# Patient Record
Sex: Male | Born: 1950 | Race: White | Hispanic: No | Marital: Single | Smoking: Never smoker
Health system: Southern US, Community
[De-identification: ages and names within clinical notes are randomized; demographics above are authoritative.]

## PROBLEM LIST (undated history)

## (undated) DIAGNOSIS — I5022 Chronic systolic (congestive) heart failure: Secondary | ICD-10-CM

## (undated) HISTORY — PX: ELBOW BURSA SURGERY: SHX615

## (undated) HISTORY — PX: GALLBLADDER SURGERY: SHX652

## (undated) HISTORY — PX: HERNIA REPAIR: SHX51

---

## 2020-01-23 ENCOUNTER — Emergency Department (HOSPITAL_COMMUNITY): Payer: Medicare HMO

## 2020-01-23 ENCOUNTER — Inpatient Hospital Stay (HOSPITAL_COMMUNITY): Payer: Medicare HMO

## 2020-01-23 ENCOUNTER — Encounter (HOSPITAL_COMMUNITY): Payer: Self-pay

## 2020-01-23 ENCOUNTER — Inpatient Hospital Stay (HOSPITAL_COMMUNITY)
Admission: EM | Admit: 2020-01-23 | Discharge: 2020-01-26 | DRG: 292 | Disposition: A | Discharge status: Home / Self Care | Payer: Medicare HMO | Attending: Internal Medicine | Admitting: Internal Medicine

## 2020-01-23 ENCOUNTER — Other Ambulatory Visit: Payer: Self-pay

## 2020-01-23 DIAGNOSIS — Z8249 Family history of ischemic heart disease and other diseases of the circulatory system: Secondary | ICD-10-CM | POA: Diagnosis not present

## 2020-01-23 DIAGNOSIS — Z79899 Other long term (current) drug therapy: Secondary | ICD-10-CM | POA: Diagnosis not present

## 2020-01-23 DIAGNOSIS — I77810 Thoracic aortic ectasia: Secondary | ICD-10-CM | POA: Diagnosis not present

## 2020-01-23 DIAGNOSIS — Z20822 Contact with and (suspected) exposure to covid-19: Secondary | ICD-10-CM | POA: Diagnosis not present

## 2020-01-23 DIAGNOSIS — G4700 Insomnia, unspecified: Secondary | ICD-10-CM | POA: Diagnosis not present

## 2020-01-23 DIAGNOSIS — R918 Other nonspecific abnormal finding of lung field: Secondary | ICD-10-CM | POA: Diagnosis not present

## 2020-01-23 DIAGNOSIS — E872 Acidosis: Secondary | ICD-10-CM | POA: Diagnosis not present

## 2020-01-23 DIAGNOSIS — J9601 Acute respiratory failure with hypoxia: Secondary | ICD-10-CM | POA: Diagnosis present

## 2020-01-23 DIAGNOSIS — I5023 Acute on chronic systolic (congestive) heart failure: Secondary | ICD-10-CM | POA: Diagnosis not present

## 2020-01-23 DIAGNOSIS — F329 Major depressive disorder, single episode, unspecified: Secondary | ICD-10-CM | POA: Diagnosis present

## 2020-01-23 DIAGNOSIS — I083 Combined rheumatic disorders of mitral, aortic and tricuspid valves: Secondary | ICD-10-CM | POA: Diagnosis present

## 2020-01-23 DIAGNOSIS — R531 Weakness: Secondary | ICD-10-CM | POA: Diagnosis not present

## 2020-01-23 DIAGNOSIS — I5022 Chronic systolic (congestive) heart failure: Secondary | ICD-10-CM | POA: Insufficient documentation

## 2020-01-23 DIAGNOSIS — K219 Gastro-esophageal reflux disease without esophagitis: Secondary | ICD-10-CM | POA: Diagnosis not present

## 2020-01-23 DIAGNOSIS — Z9119 Patient's noncompliance with other medical treatment and regimen: Secondary | ICD-10-CM | POA: Diagnosis not present

## 2020-01-23 DIAGNOSIS — R0602 Shortness of breath: Secondary | ICD-10-CM | POA: Diagnosis not present

## 2020-01-23 DIAGNOSIS — R590 Localized enlarged lymph nodes: Secondary | ICD-10-CM | POA: Diagnosis present

## 2020-01-23 DIAGNOSIS — I340 Nonrheumatic mitral (valve) insufficiency: Secondary | ICD-10-CM | POA: Diagnosis present

## 2020-01-23 DIAGNOSIS — Z515 Encounter for palliative care: Secondary | ICD-10-CM | POA: Diagnosis not present

## 2020-01-23 DIAGNOSIS — J90 Pleural effusion, not elsewhere classified: Secondary | ICD-10-CM | POA: Diagnosis not present

## 2020-01-23 DIAGNOSIS — I428 Other cardiomyopathies: Secondary | ICD-10-CM | POA: Diagnosis present

## 2020-01-23 DIAGNOSIS — C3411 Malignant neoplasm of upper lobe, right bronchus or lung: Secondary | ICD-10-CM | POA: Diagnosis not present

## 2020-01-23 DIAGNOSIS — Z811 Family history of alcohol abuse and dependence: Secondary | ICD-10-CM | POA: Diagnosis not present

## 2020-01-23 DIAGNOSIS — I447 Left bundle-branch block, unspecified: Secondary | ICD-10-CM | POA: Diagnosis not present

## 2020-01-23 DIAGNOSIS — Z9114 Patient's other noncompliance with medication regimen: Secondary | ICD-10-CM

## 2020-01-23 DIAGNOSIS — I5043 Acute on chronic combined systolic (congestive) and diastolic (congestive) heart failure: Secondary | ICD-10-CM | POA: Diagnosis not present

## 2020-01-23 DIAGNOSIS — I509 Heart failure, unspecified: Secondary | ICD-10-CM | POA: Diagnosis not present

## 2020-01-23 DIAGNOSIS — Z7189 Other specified counseling: Secondary | ICD-10-CM | POA: Diagnosis not present

## 2020-01-23 HISTORY — DX: Chronic systolic (congestive) heart failure: I50.22

## 2020-01-23 LAB — BASIC METABOLIC PANEL
Anion gap: 11 (ref 5–15)
BUN: 32 mg/dL — ABNORMAL HIGH (ref 8–23)
CO2: 18 mmol/L — ABNORMAL LOW (ref 22–32)
Calcium: 9 mg/dL (ref 8.9–10.3)
Chloride: 109 mmol/L (ref 98–111)
Creatinine, Ser: 1.09 mg/dL (ref 0.61–1.24)
GFR calc Af Amer: >60 mL/min (ref >60)
GFR calc non Af Amer: >60 mL/min (ref >60)
Glucose, Bld: 111 mg/dL — ABNORMAL HIGH (ref 70–99)
Potassium: 4 mmol/L (ref 3.5–5.1)
Sodium: 138 mmol/L (ref 135–145)

## 2020-01-23 LAB — CBC WITH DIFFERENTIAL/PLATELET
Abs Immature Granulocytes: 0.03 10*3/uL (ref 0.00–0.07)
Basophils Absolute: 0 10*3/uL (ref 0.0–0.1)
Basophils Relative: 1 %
Eosinophils Absolute: 0.2 10*3/uL (ref 0.0–0.5)
Eosinophils Relative: 3 %
HCT: 40.6 % (ref 39.0–52.0)
Hemoglobin: 13 g/dL (ref 13.0–17.0)
Immature Granulocytes: 1 %
Lymphocytes Relative: 25 %
Lymphs Abs: 1.6 10*3/uL (ref 0.7–4.0)
MCH: 28.8 pg (ref 26.0–34.0)
MCHC: 32 g/dL (ref 30.0–36.0)
MCV: 89.8 fL (ref 80.0–100.0)
Monocytes Absolute: 0.7 10*3/uL (ref 0.1–1.0)
Monocytes Relative: 11 %
Neutro Abs: 4.1 10*3/uL (ref 1.7–7.7)
Neutrophils Relative %: 59 %
Platelets: 203 10*3/uL (ref 150–400)
RBC: 4.52 MIL/uL (ref 4.22–5.81)
RDW: 14 % (ref 11.5–15.5)
WBC: 6.6 10*3/uL (ref 4.0–10.5)
nRBC: 0 % (ref 0.0–0.2)

## 2020-01-23 LAB — TROPONIN I (HIGH SENSITIVITY)
Troponin I (High Sensitivity): 23 ng/L — ABNORMAL HIGH (ref <18)
Troponin I (High Sensitivity): 28 ng/L — ABNORMAL HIGH (ref <18)

## 2020-01-23 LAB — SARS CORONAVIRUS 2 (TAT 6-24 HRS): SARS Coronavirus 2: NEGATIVE

## 2020-01-23 LAB — BRAIN NATRIURETIC PEPTIDE: B Natriuretic Peptide: 1845.6 pg/mL — ABNORMAL HIGH (ref 0.0–100.0)

## 2020-01-23 MED ORDER — ACETAMINOPHEN 325 MG PO TABS: 650.0000 mg | ORAL_TABLET | ORAL | Status: DC | PRN

## 2020-01-23 MED ORDER — DOXYLAMINE SUCCINATE (SLEEP) 25 MG PO TABS
25.0000 mg | ORAL_TABLET | Freq: Every evening | ORAL | Status: DC | PRN
  Filled 2020-01-23 (×2): qty 1

## 2020-01-23 MED ORDER — ENOXAPARIN SODIUM 40 MG/0.4ML ~~LOC~~ SOLN
40.0000 mg | SUBCUTANEOUS | Status: DC
  Administered 2020-01-23 – 2020-01-25 (×3): 40 mg via SUBCUTANEOUS
  Filled 2020-01-23 (×3): qty 0.4

## 2020-01-23 MED ORDER — SODIUM CHLORIDE 0.9 % IV SOLN: 250.0000 mL | INTRAVENOUS | Status: DC | PRN

## 2020-01-23 MED ORDER — FUROSEMIDE 10 MG/ML IJ SOLN
20.0000 mg | Freq: Once | INTRAMUSCULAR | Status: AC
  Administered 2020-01-23: 10:00:00 20 mg via INTRAVENOUS
  Filled 2020-01-23: qty 4

## 2020-01-23 MED ORDER — PANTOPRAZOLE SODIUM 40 MG PO TBEC
40.0000 mg | DELAYED_RELEASE_TABLET | Freq: Every day | ORAL | Status: DC
  Administered 2020-01-24 – 2020-01-26 (×3): 40 mg via ORAL
  Filled 2020-01-23 (×3): qty 1

## 2020-01-23 MED ORDER — FUROSEMIDE 10 MG/ML IJ SOLN
20.0000 mg | Freq: Two times a day (BID) | INTRAMUSCULAR | Status: DC
  Administered 2020-01-24: 20 mg via INTRAVENOUS
  Filled 2020-01-23: qty 2

## 2020-01-23 MED ORDER — SODIUM CHLORIDE 0.9% FLUSH
3.0000 mL | Freq: Two times a day (BID) | INTRAVENOUS | Status: DC
  Administered 2020-01-23 – 2020-01-25 (×5): 3 mL via INTRAVENOUS

## 2020-01-23 MED ORDER — ONDANSETRON HCL 4 MG/2ML IJ SOLN: 4.0000 mg | Freq: Four times a day (QID) | INTRAMUSCULAR | Status: DC | PRN

## 2020-01-23 MED ORDER — SODIUM CHLORIDE 0.9% FLUSH: 3.0000 mL | INTRAVENOUS | Status: DC | PRN

## 2020-01-23 NOTE — ED Notes (Addendum)
Patient kept on falling asleep and desat to 6s. Dr. Tyrone Nine aware of this issue. When patient is awake his spo2 is 98%. RN put patient on 2L [place] for comfort when he sleeps. Patient spo2 now 97%.

## 2020-01-23 NOTE — ED Provider Notes (Signed)
Philip Campbell DEPT Provider Note   CSN: [id number] Arrival date & time: 01/23/20  0759     History Chief Complaint  Patient presents with  . Shortness of Breath    Philip Campbell is a 69 y.o. male.  69 yo M with a chief complaints of fatigue and shortness of breath.  This been going on for the past week.  States that it usually is worse with sitting or lying back flat.  Has been having a lot of trouble lying back flat especially when he tries to go to sleep.  Gets mildly better with exertion.  He denies cough or fever denies chest pain or pressure.  States he had a recent work-up  The history is provided by the patient.  Shortness of Breath Severity:  Moderate Onset quality:  Gradual Duration:  2 days Timing:  Constant Progression:  Worsening Chronicity:  New Relieved by:  Nothing Worsened by:  Activity Ineffective treatments:  None tried Associated symptoms: no abdominal pain, no chest pain, no fever, no headaches, no rash and no vomiting        History reviewed. No pertinent past medical history.  Patient Active Problem List   Diagnosis Date Noted  . Acute on chronic systolic CHF (congestive heart failure) ([place]) 01/23/2020  . GERD (gastroesophageal reflux disease) 01/23/2020  . Chronic systolic CHF (congestive heart failure), NYHA class 4 ([place]) 01/23/2020  . Non-ischemic cardiomyopathy ([place]) 01/23/2020    History reviewed. No pertinent surgical history.     No family history on file.  Social History   Tobacco Use  . Smoking status: Not on file  Substance Use Topics  . Alcohol use: Not on file  . Drug use: Not on file    Home Medications Prior to Admission medications   Medication Sig Start Date End Date Taking? Authorizing Provider  doxylamine, Sleep, (UNISOM) 25 MG tablet Take 50 mg by mouth at bedtime as needed for sleep.   Yes [provider]  furosemide (LASIX) 20 MG tablet Take 20 mg by mouth daily.  12/17/19  Yes  [provider]  pantoprazole (PROTONIX) 40 MG tablet Take 40 mg by mouth daily. 12/08/19  Yes [provider]    Allergies    Patient has no known allergies.  Review of Systems   Review of Systems  Constitutional: Positive for fatigue. Negative for chills and fever.  HENT: Negative for congestion and facial swelling.   Eyes: Negative for discharge and visual disturbance.  Respiratory: Positive for shortness of breath.   Cardiovascular: Negative for chest pain and palpitations.  Gastrointestinal: Negative for abdominal pain, diarrhea and vomiting.  Musculoskeletal: Negative for arthralgias and myalgias.  Skin: Negative for color change and rash.  Neurological: Negative for tremors, syncope and headaches.  Psychiatric/Behavioral: Negative for confusion and dysphoric mood.    Physical Exam Updated Vital Signs BP 91/79   Pulse 96   Temp (!) 97.4 F (36.3 C) (Oral)   Resp 16   Ht 5\' 11"  (1.803 m)   Wt 70.3 kg   SpO2 98%   BMI 21.62 kg/m   Physical Exam Vitals and nursing note reviewed.  Constitutional:      Appearance: He is well-developed.  HENT:     Head: Normocephalic and atraumatic.  Eyes:     Pupils: Pupils are equal, round, and reactive to light.  Neck:     Vascular: JVD (to the jaw) present.  Cardiovascular:     Rate and Rhythm: Normal rate and  regular rhythm.     Heart sounds: No murmur. No friction rub. No gallop.   Pulmonary:     Effort: No respiratory distress.     Breath sounds: Examination of the right-lower field reveals rales. Examination of the left-lower field reveals rales. Rales present. No wheezing.     Comments: Faint rales in the bases Abdominal:     General: There is no distension.     Tenderness: There is no guarding or rebound.  Musculoskeletal:        General: Normal range of motion.     Cervical back: Normal range of motion and neck supple.  Skin:    Coloration: Skin is not pale.     Findings: No rash.  Neurological:      Mental Status: He is alert and oriented to person, place, and time.  Psychiatric:        Behavior: Behavior normal.     ED Results / Procedures / Treatments   Labs (all labs ordered are listed, but only abnormal results are displayed) Labs Reviewed  BASIC METABOLIC PANEL - Abnormal; Notable for the following components:      Result Value   CO2 18 (*)    Glucose, Bld 111 (*)    BUN 32 (*)    All other components within normal limits  BRAIN NATRIURETIC PEPTIDE - Abnormal; Notable for the following components:   B Natriuretic Peptide 1,845.6 (*)    All other components within normal limits  TROPONIN I (HIGH SENSITIVITY) - Abnormal; Notable for the following components:   Troponin I (High Sensitivity) 23 (*)    All other components within normal limits  TROPONIN I (HIGH SENSITIVITY) - Abnormal; Notable for the following components:   Troponin I (High Sensitivity) 28 (*)    All other components within normal limits  SARS CORONAVIRUS 2 (TAT 6-24 HRS)  CBC WITH DIFFERENTIAL/PLATELET    EKG EKG Interpretation  Date/Time:  Wednesday January 23 2020 08:30:28 EDT Ventricular Rate:  96 PR Interval:    QRS Duration: 143 QT Interval:  409 QTC Calculation: 517 R Axis:   -68 Text Interpretation: Sinus rhythm Left atrial enlargement Left bundle branch block No old tracing to compare Confirmed by Deno Etienne [phone]) on 01/23/2020 8:36:04 AM   Radiology DG Chest 2 View  Result Date: 01/23/2020 CLINICAL DATA:  Shortness of breath. History of CHF EXAM: CHEST - 2 VIEW COMPARISON:  None. FINDINGS: Cardiomegaly. Pulmonary vascular congestion and bilateral interstitial prominence. Slightly ill-defined small rounded opacity within the peripheral aspect of the right upper lobe, which may reflect an area of edema or focal infiltrate. No pleural effusion or pneumothorax. Degenerative changes of the bilateral shoulders and thoracic spine. IMPRESSION: 1. Cardiomegaly with pulmonary vascular congestion  and interstitial prominence most consistent with CHF. 2. Slightly ill-defined rounded opacity within the peripheral aspect of the right upper lobe, which may reflect an area of edema or focal infiltrate. Radiographic follow-up to resolution is recommended to exclude an underlying nodule. Electronically Signed   By: Davina Poke D.O.   On: 01/23/2020 08:58    Procedures Ultrasound ED Echo  Date/Time: 01/23/2020 9:30 AM Performed by: Deno Etienne, DO Authorized by: Deno Etienne, DO   Procedure details:    Indications: dyspnea     Views: subxiphoid, parasternal long axis view, apical 4 chamber view and IVC view     Images: archived   Findings:    Pericardium: no pericardial effusion     LV Function: severly depressed (<30%)  RV Diameter: normal     IVC: dilated   Impression:    Impression: probable elevated CVP and decreased contractility     (including critical care time)  Medications Ordered in ED Medications  furosemide (LASIX) injection 20 mg (20 mg Intravenous Given 01/23/20 1019)    ED Course  I have reviewed the triage vital signs and the nursing notes.  Pertinent labs & imaging results that were available during my care of the patient were reviewed by me and considered in my medical decision making (see chart for details).    MDM Rules/Calculators/A&P                      69 yo M with a chief complaint of shortness of breath.  Going on for about 6 days.  Having some orthopnea with this.  Exam with significant JVD.  Reportedly had a recent visits to the [facility] facility however unable to obtain any records through care everywhere.  We will try to manually obtain them.  Chest x-ray lab work reassess.  Chest x-ray viewed by me with interstitial fluid.  Widened cardiac silhouette.  Bedside ultrasound also confirms decreased EF.  No pericardial effusion.  Acidosis of unknown etiology.  Will give a bolus dose of Lasix.  Of note when I went to reexamine the patient his oxygen  saturation was 84 with a good waveform.  Immediately improved upon awakening.  We were able to obtain old records from [place].  Patient had a visit in October where he was newly diagnosed with heart failure.  Had an EF of 20 to 25%.  Cardiac cath mostly clean.  O2 down into the 60's when asleep with good waveform.  Discussed with hospitalist for admission.   CRITICAL CARE Performed by: Cecilio Asper   Total critical care time: 35 minutes  Critical care time was exclusive of separately billable procedures and treating other patients.  Critical care was necessary to treat or prevent imminent or life-threatening deterioration.  Critical care was time spent personally by me on the following activities: development of treatment plan with patient and/or surrogate as well as nursing, discussions with consultants, evaluation of patient's response to treatment, examination of patient, obtaining history from patient or surrogate, ordering and performing treatments and interventions, ordering and review of laboratory studies, ordering and review of radiographic studies, pulse oximetry and re-evaluation of patient's condition.  The patients results and plan were reviewed and discussed.   Any x-rays performed were independently reviewed by myself.   Differential diagnosis were considered with the presenting HPI.  Medications  furosemide (LASIX) injection 20 mg (20 mg Intravenous Given 01/23/20 1019)    Vitals:   01/23/20 0825 01/23/20 0830 01/23/20 1016  BP: 104/87 105/86 91/79  Pulse: (!) 103 99 96  Resp: (!) 26 16 16   Temp: (!) 97.4 F (36.3 C)    TempSrc: Oral    SpO2: 100% 100% 98%  Weight: 70.3 kg    Height: 5\' 11"  (1.803 m)      Final diagnoses:  Acute on chronic systolic congestive heart failure (HCC)    Admission/ observation were discussed with the admitting physician, patient and/or family and they are comfortable with the plan.   Final Clinical Impression(s) /  ED Diagnoses Final diagnoses:  Acute on chronic systolic congestive heart failure [facility])    Rx / DC Orders ED Discharge Orders    None       Deno Etienne, DO 01/23/20 1142

## 2020-01-23 NOTE — H&P (Addendum)
History and Physical  Philip Campbell ENI:[id number] DOB: April 26, 1951 DOA: 01/23/2020  Referring physician: Deno Etienne, ER physician PCP: Patient, No Pcp Per  Outpatient Specialists: Scheduled to see outpatient cardiology in [place], had not gone Patient coming from: Home & is able to ambulate without assistance  Chief Complaint: Shortness of breath  Note: Patient's previous hospitalization was in September 2020 at [facility] in [place].  For some reason, these records are not showing in epic.  [facility] sent over records by fax and much of these reports are mentioned in this H&P.  HPI: Philip Campbell is a 69 y.o. male with medical history significant for GERD and nonischemic cardiomyopathy/severe mitral regurg/systolic CHF with ejection fraction of 20% diagnosed 6 months ago at [facility] in [place].  Patient had come in for shortness of breath and found to have pneumonia, pulmonary nodules and during course of work-up discovered to have advanced CHF.  At that time, he was diuresed and discharged home on Lasix, ACE inhibitor and beta-blocker and was told to follow-up with cardiology.  In that time, patient had since moved from [place] to [place].  He was not able to follow-up with cardiology.  As per his doctor's instructions, he had to stop taking his beta-blocker and ACE inhibitor due to low blood pressure, at times systolic less than 90.  For the last 6 days, he has noted increasing dyspnea on exertion along with a nonproductive cough.  He developed orthopnea, managed by sleeping propped up.  Patient tells me that at time of discharge from the hospital, he weighed 140 pounds.  In that time, his weight has increased to around 155 pounds, to his knowledge.  With his worsening shortness of breath, he came to the emergency room today for further evaluation.  ED Course: In the emergency room, patient was noted to have a BNP of 1846.  His weight was 168 pounds.  Chest x-ray  consistent with CHF.  Initially oxygen saturations were above 90% on room air, but when patient fell asleep, his oxygen saturations dropped to 78% on room air.  Placed on 2 L and oxygen levels went above 90 again.  Patient given IV Lasix and hospitalist called for further evaluation and admission.  Review of Systems: Patient seen in the emergency room. Pt complains of mild shortness of breath, although better with oxygen.  Complains of orthopnea and nonproductive cough  Pt denies any headaches, vision changes, dysphagia, chest pain, palpitations, wheezing, abdominal pain, hematuria, dysuria, constipation or diarrhea, focal extremity numbness weakness or pain.  Review of systems are otherwise negative   Past medical history: Nonischemic cardiomyopathy, systolic CHF with ejection fraction of 20-25%, moderate to severe mitral regurg, GERD, pulmonary nodules  Surgical history: Patient had bracket placed in his left elbow after injury  Social History: Lives with a roommate here in [place].  Denies any tobacco, alcohol or drug use.  Ambulates without assistance.  No Known Allergies  Family history: Patient denies any medical problems run in his family  Prior to Admission medications   Medication Sig Start Date End Date Taking? Authorizing Provider  doxylamine, Sleep, (UNISOM) 25 MG tablet Take 50 mg by mouth at bedtime as needed for sleep.   Yes [provider]  furosemide (LASIX) 20 MG tablet Take 20 mg by mouth daily.  12/17/19  Yes [provider]  pantoprazole (PROTONIX) 40 MG tablet Take 40 mg by mouth daily. 12/08/19  Yes [provider]    Physical Exam: BP 94/80 (BP Location:  Right Arm)   Pulse 96   Temp 97.6 F (36.4 C) (Oral)   Resp (!) 24   Ht 5\' 11"  (1.803 m)   Wt 76.7 kg   SpO2 97%   BMI 23.57 kg/m   General: Alert and oriented x3, no acute distress Eyes: Sclera nonicteric, extraocular movements are intact ENT: Normocephalic and atraumatic,  mucous membranes are moist Neck: Supple, mild JVD Cardiovascular: Regular rate and rhythm, S1-S2, 2 out of 6 systolic ejection murmur Respiratory: Clear to auscultation bilaterally Abdomen: Soft, nontender, nondistended, positive bowel sounds Skin: No skin breaks, tears or lesions Musculoskeletal: No clubbing or cyanosis, 1+ pitting edema from the knees down bilaterally Psychiatric: Appropriate, no evidence of psychoses Neurologic: No focal deficits          Labs on Admission:  Basic Metabolic Panel: Recent Labs  Lab 01/23/20 0837  NA 138  K 4.0  CL 109  CO2 18*  GLUCOSE 111*  BUN 32*  CREATININE 1.09  CALCIUM 9.0   Liver Function Tests: No results for input(s): AST, ALT, ALKPHOS, BILITOT, PROT, ALBUMIN in the last 168 hours. No results for input(s): LIPASE, AMYLASE in the last 168 hours. No results for input(s): AMMONIA in the last 168 hours. CBC: Recent Labs  Lab 01/23/20 0837  WBC 6.6  NEUTROABS 4.1  HGB 13.0  HCT 40.6  MCV 89.8  PLT 203   Cardiac Enzymes: No results for input(s): CKTOTAL, CKMB, CKMBINDEX, TROPONINI in the last 168 hours.  BNP (last 3 results) Recent Labs    01/23/20 0837  BNP 1,845.6*    ProBNP (last 3 results) No results for input(s): PROBNP in the last 8760 hours.  CBG: No results for input(s): GLUCAP in the last 168 hours.  Radiological Exams on Admission: DG Chest 2 View  Result Date: 01/23/2020 CLINICAL DATA:  Shortness of breath. History of CHF EXAM: CHEST - 2 VIEW COMPARISON:  None. FINDINGS: Cardiomegaly. Pulmonary vascular congestion and bilateral interstitial prominence. Slightly ill-defined small rounded opacity within the peripheral aspect of the right upper lobe, which may reflect an area of edema or focal infiltrate. No pleural effusion or pneumothorax. Degenerative changes of the bilateral shoulders and thoracic spine. IMPRESSION: 1. Cardiomegaly with pulmonary vascular congestion and interstitial prominence most  consistent with CHF. 2. Slightly ill-defined rounded opacity within the peripheral aspect of the right upper lobe, which may reflect an area of edema or focal infiltrate. Radiographic follow-up to resolution is recommended to exclude an underlying nodule. Electronically Signed   By: Davina Poke D.O.   On: 01/23/2020 08:58    EKG: Independently reviewed.  Sinus rhythm with left atrial enlargement and left bundle branch block noted.  No previous EKG to compare, however when reviewing patient's records from previous hospitalization at [facility], they comment on some nonspecific ST and T wave changes, left atrial enlargement, but no mention of the left bundle branch block  Assessment/Plan Present on Admission: . Acute on chronic systolic CHF (congestive heart failure) (HCC)/nonischemic cardiomyopathy/moderate to severe mitral regurg causing acute respiratory failure with hypoxia: We will diurese.  Unfortunately, cannot aggressively diurese due to soft blood pressures.  [facility] cardiology will see tomorrow.  Previous heart catheterization noted nonischemia, however bundle branch block does not appear to be present from EKG report last time.  Will defer to cardiology to see if further work-up initiated.  Long-term, if needs continuous follow-up in advanced heart care clinic,?  Mitral repair  . GERD (gastroesophageal reflux disease): Continue PPI  . Multiple lung nodules  on CT: Reported on patient's CT: Has been advised to get repeat CT in December, but does not appear to have had appointment set up.  Will recheck now.  Principal Problem:   Acute on chronic systolic CHF (congestive heart failure) (HCC) Active Problems:   GERD (gastroesophageal reflux disease)   Non-ischemic cardiomyopathy (HCC)   Acute respiratory failure with hypoxia (HCC)   Moderate to severe mitral regurgitation   Multiple lung nodules on CT   DVT prophylaxis: Lovenox  Code Status: Full code as confirmed by patient after  extensive discussion  Family Communication: Patient has no family, declined for me to call his friend until he knows more  Disposition Plan: Anticipate he will be here for several days while we are fully diuresing him, working up his CHF and following up on his lung nodules  Consults called: Cardiology Palliative care  Admission status: Given need for acute hospital services and will be here past 2 midnights, admit as inpatient    Annita Brod MD [facility] Pager [phone]  If 7PM-7AM, please contact night-coverage [email or web address] Password [facility]  01/23/2020, 4:19 PM

## 2020-01-23 NOTE — ED Triage Notes (Signed)
Patient c/o trouble breathing at rest and upon exertion. Patient denies chest pain or pain of any kind. Patient has hx: heart issues but does not know what those problems are. Patient also had pneumonia and respiratory failure back in November and was hospitalized. Patient c/o increasing weakness and inability to sleep.

## 2020-01-24 ENCOUNTER — Inpatient Hospital Stay (HOSPITAL_COMMUNITY): Payer: Medicare HMO

## 2020-01-24 ENCOUNTER — Encounter (HOSPITAL_COMMUNITY): Payer: Self-pay | Admitting: Internal Medicine

## 2020-01-24 DIAGNOSIS — I428 Other cardiomyopathies: Secondary | ICD-10-CM

## 2020-01-24 DIAGNOSIS — J9601 Acute respiratory failure with hypoxia: Secondary | ICD-10-CM

## 2020-01-24 DIAGNOSIS — R531 Weakness: Secondary | ICD-10-CM

## 2020-01-24 DIAGNOSIS — Z515 Encounter for palliative care: Secondary | ICD-10-CM

## 2020-01-24 DIAGNOSIS — G4700 Insomnia, unspecified: Secondary | ICD-10-CM

## 2020-01-24 DIAGNOSIS — I5023 Acute on chronic systolic (congestive) heart failure: Secondary | ICD-10-CM

## 2020-01-24 DIAGNOSIS — R918 Other nonspecific abnormal finding of lung field: Secondary | ICD-10-CM

## 2020-01-24 DIAGNOSIS — Z7189 Other specified counseling: Secondary | ICD-10-CM

## 2020-01-24 DIAGNOSIS — I340 Nonrheumatic mitral (valve) insufficiency: Secondary | ICD-10-CM

## 2020-01-24 DIAGNOSIS — I509 Heart failure, unspecified: Secondary | ICD-10-CM

## 2020-01-24 LAB — BASIC METABOLIC PANEL
Anion gap: 8 (ref 5–15)
BUN: 36 mg/dL — ABNORMAL HIGH (ref 8–23)
CO2: 23 mmol/L (ref 22–32)
Calcium: 8.8 mg/dL — ABNORMAL LOW (ref 8.9–10.3)
Chloride: 106 mmol/L (ref 98–111)
Creatinine, Ser: 1.13 mg/dL (ref 0.61–1.24)
GFR calc Af Amer: >60 mL/min (ref >60)
GFR calc non Af Amer: >60 mL/min (ref >60)
Glucose, Bld: 111 mg/dL — ABNORMAL HIGH (ref 70–99)
Potassium: 4.1 mmol/L (ref 3.5–5.1)
Sodium: 137 mmol/L (ref 135–145)

## 2020-01-24 LAB — ECHOCARDIOGRAM COMPLETE
Height: 71 in
Weight: 2737.6 oz

## 2020-01-24 LAB — MAGNESIUM: Magnesium: 1.8 mg/dL (ref 1.7–2.4)

## 2020-01-24 MED ORDER — FUROSEMIDE 10 MG/ML IJ SOLN: 40.0000 mg | Freq: Two times a day (BID) | INTRAMUSCULAR | Status: DC

## 2020-01-24 MED ORDER — MIRTAZAPINE 15 MG PO TBDP
7.5000 mg | ORAL_TABLET | Freq: Every day | ORAL | Status: DC
  Administered 2020-01-24 – 2020-01-25 (×2): 7.5 mg via ORAL
  Filled 2020-01-24 (×3): qty 0.5

## 2020-01-24 MED ORDER — CARVEDILOL 3.125 MG PO TABS
3.1250 mg | ORAL_TABLET | Freq: Two times a day (BID) | ORAL | Status: DC
  Administered 2020-01-24 – 2020-01-26 (×3): 3.125 mg via ORAL
  Filled 2020-01-24 (×4): qty 1

## 2020-01-24 MED ORDER — FUROSEMIDE 10 MG/ML IJ SOLN
40.0000 mg | Freq: Every day | INTRAMUSCULAR | Status: DC
  Administered 2020-01-24: 40 mg via INTRAVENOUS
  Filled 2020-01-24: qty 4

## 2020-01-24 NOTE — Progress Notes (Signed)
PROGRESS NOTE    Philip Campbell  HMC:[id number] DOB: [birth year] DOA: 01/23/2020 PCP: Patient, No Pcp Per    Brief Narrative:  Patient was admitted to the hospital with working diagnosis of acute on chronic decompensated systolic heart failure.  69 year old male who presented with dyspnea.  He does have significant past medical history for nonischemic cardiomyopathy, severe mitral vegetation, systolic heart failure and GERD.  Patient was recently diagnosed with heart failure, placed on heart failure regimen including afterload reducing agents.  Currently ACE inhibitor was stopped due to hypotension.  Her last 6 days he has developed worsening dyspnea, associated with orthopnea and 15 pound weight gain.  On his initial physical examination his blood pressure was 94/80, pulse rate 96, temperature 97.6, respiratory rate 24, oxygen saturation 97%, lungs were clear to auscultation bilaterally, heart S1-S2 present rhythmic, soft abdomen, no lower extremity edema.  Further work up with CT chest showed a right upper lobe mass. He has been started on aggressive diuresis for heart failure. Further heart failure management and advanced therapies will depend on results of pulmonary work up, if malignancy confirmed likely not candidate for more invasive heart failure interventions.    Assessment & Plan:   Principal Problem:   Acute on chronic systolic CHF (congestive heart failure) (HCC) Active Problems:   GERD (gastroesophageal reflux disease)   Non-ischemic cardiomyopathy (HCC)   Acute respiratory failure with hypoxia (HCC)   Moderate to severe mitral regurgitation   Multiple lung nodules on CT   1. Acute on chronic systolic heart failure decompensation, non ischemic cardiomyopathy. Continue fluid overloaded, urine output over last 24 H is 1,950 ml, blood pressure 112 mmHg.   Will continue diuresis with furosemide and b blockade with carvedilol.   2. Right upper lobe mass. 3.8x 2,0x 1,8 cm. Will need  further work up, consulted pulmonary for evaluation if patient will be candidate for diagnostic bronchoscopy or CT guided biopsy.   3. Depression. Continue with mirtazapine.     DVT prophylaxis: enoxaparin   Code Status:  full Family Communication: no family at the bedside  Disposition Plan/ discharge barriers: patient from home barrier for dc decompensated heart failure needing IV therapies.   Consultants:   Cardiology   Pulmonary   Subjective: Patient is feeling better, but not yet back to baseline, continue to have dyspnea with minimal efforts, no nausea or vomiting, no chest pain.   Objective: Vitals:   01/23/20 2350 01/24/20 0356 01/24/20 0956 01/24/20 1256  BP: 105/75 114/85 109/80 112/82  Pulse: 92 (!) 105 98 (!) 104  Resp: (!) 24 (!) 24 20 19   Temp: 97.6 F (36.4 C) 97.9 F (36.6 C) 97.9 F (36.6 C) 98.2 F (36.8 C)  TempSrc: Oral Oral Oral Oral  SpO2: 93% 99% 98% 98%  Weight:  77.6 kg    Height:        Intake/Output Summary (Last 24 hours) at 01/24/2020 1334 Last data filed at 01/24/2020 1200 Gross per 24 hour  Intake 483 ml  Output 3375 ml  Net -2892 ml   Filed Weights   01/23/20 0825 01/23/20 1544 01/24/20 0356  Weight: 70.3 kg 76.7 kg 77.6 kg    Examination:   General: Not in pain or dyspnea, deconditioned  Neurology: Awake and alert, non focal  E ENT: mild pallor, no icterus, oral mucosa moist Cardiovascular: No JVD. S1-S2 present, rhythmic, no gallops, or rubs No lower extremity edema. Positive systolic murmur 3/6 at the apex.  Pulmonary: positive breath sounds bilaterally, no wheezing,  or rhonchi, mild  Rales at bases. Gastrointestinal. Abdomen with, no organomegaly, non tender, no rebound or guarding Skin. No rashes Musculoskeletal: no joint deformities     Data Reviewed: I have personally reviewed following labs and imaging studies  CBC: Recent Labs  Lab 01/23/20 0837  WBC 6.6  NEUTROABS 4.1  HGB 13.0  HCT 40.6  MCV 89.8  PLT 644    Basic Metabolic Panel: Recent Labs  Lab 01/23/20 0837 01/24/20 0439 01/24/20 0945  NA 138 137  --   K 4.0 4.1  --   CL 109 106  --   CO2 18* 23  --   GLUCOSE 111* 111*  --   BUN 32* 36*  --   CREATININE 1.09 1.13  --   CALCIUM 9.0 8.8*  --   MG  --   --  1.8   GFR: Estimated Creatinine Clearance: 64.8 mL/min (by C-G formula based on SCr of 1.13 mg/dL). Liver Function Tests: No results for input(s): AST, ALT, ALKPHOS, BILITOT, PROT, ALBUMIN in the last 168 hours. No results for input(s): LIPASE, AMYLASE in the last 168 hours. No results for input(s): AMMONIA in the last 168 hours. Coagulation Profile: No results for input(s): INR, PROTIME in the last 168 hours. Cardiac Enzymes: No results for input(s): CKTOTAL, CKMB, CKMBINDEX, TROPONINI in the last 168 hours. BNP (last 3 results) No results for input(s): PROBNP in the last 8760 hours. HbA1C: No results for input(s): HGBA1C in the last 72 hours. CBG: No results for input(s): GLUCAP in the last 168 hours. Lipid Profile: No results for input(s): CHOL, HDL, LDLCALC, TRIG, CHOLHDL, LDLDIRECT in the last 72 hours. Thyroid Function Tests: No results for input(s): TSH, T4TOTAL, FREET4, T3FREE, THYROIDAB in the last 72 hours. Anemia Panel: No results for input(s): VITAMINB12, FOLATE, FERRITIN, TIBC, IRON, RETICCTPCT in the last 72 hours.    Radiology Studies: I have reviewed all of the imaging during this hospital visit personally     Scheduled Meds: . enoxaparin (LOVENOX) injection  40 mg Subcutaneous Q24H  . furosemide  40 mg Intravenous BID  . mirtazapine  7.5 mg Oral QHS  . pantoprazole  40 mg Oral Daily  . sodium chloride flush  3 mL Intravenous Q12H   Continuous Infusions: . sodium chloride       LOS: 1 day        Philip Campbell [name], MD

## 2020-01-24 NOTE — Progress Notes (Signed)
Patient ambulated hallways 180 ft on RA independently.  O2 sats remained 95-97%.  Patient experienced slight SOB but tolerated well.  Returned to room and now sitting up in chair.  Patient has "Living with Heart Failure" booklet at bedside - encouraged patient to look through it and ask questions as needed.  Patient educated on daily weighing and low sodium diet - verbalizes understanding using teach back method.

## 2020-01-24 NOTE — Consult Note (Signed)
Consultation Note Date: 01/24/2020   Patient Name: Philip Campbell  DOB: [birth year]  MRN: [id number]  Age / Sex: 69 y.o., male  PCP: Patient, No Pcp Per Referring Physician: Tawni Millers  Reason for Consultation: Establishing goals of care  HPI/Patient Profile: 69 y.o. male   admitted on 01/23/2020    Clinical Assessment and Goals of Care: 69 year old gentleman originally from [place], moved to [place] not too long ago.  Lives with his roommate and friend Winnifred Friar.  Patient has history of chronic systolic congestive heart failure, nonischemic cardiomyopathy with ejection fraction of 20%.  He also has severe mitral regurgitation and pulmonary nodules and underlying gastroesophageal reflux disease.  Patient has been admitted with weakness and shortness of breath to hospital medicine service.  He is undergoing efforts at diuresis.  He has also been seen and evaluated by cardiology.  Echocardiogram has just been done.  Additionally, CT scan of the chest was also done because the patient had pulmonary nodules and CT scan of the chest was done for follow-up of said nodules.  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  A palliative consult has been requested for CODE STATUS and goals of care discussions.  [name] is awake alert resting in bed.  He complains of dyspnea even with the slightest minimal exertion.  He tried eating his lunch tray and states that it made him very short of breath.  He felt as if he had run up and down the hallway just by trying to eat something.  He denies chest pain.  He is not confused.  I introduced myself and palliative care as follows:   Palliative medicine is specialized medical care for people living with serious illness. It focuses on providing relief from the symptoms and stress of a serious illness. The goal is to improve quality of life for both  the patient and the family.  Goals of care: Broad aims of medical therapy in relation to the patient's values and preferences. Our aim is to provide medical care aimed at enabling patients to achieve the goals that matter most to them, given the circumstances of their particular medical situation and their constraints.   I reviewed with him frankly but compassionately about his current hospitalization and work-up so far.  Discussed with him about CT scan of the chest showing mass in the right upper lobe highly concerning for primary bronchogenic adenocarcinoma.  Also discussed about things that are being done from a cardiology perspective with regards to his congestive heart failure.  Shared my worries and concerns that between his heart and lungs he has couple different serious illnesses going on.  CODE STATUS and goals of care discussions were undertaken.  Advance care planning was also discussed.  Offered active listening and supportive care.  Please note additional discussions and recommendations as listed below.   NEXT OF KIN Roommate, denies that he has any immediate family.  States that when he was living in [place] his landlord used to be his designated healthcare power of  attorney agent.  Unfortunately, he had a bad experience with that situation.  SUMMARY OF RECOMMENDATIONS   Full code/full scope for now.  Patient wishes to continue with current mode of care.  He is aware of the serious nature of his illness.  Monitor overall disease trajectory for ongoing goals of care based on hospital course. Patient elects his roommate and friend Winnifred Friar to be his healthcare power of attorney agent if the patient is not able to make such decisions.  He refrains from going through with the paperwork. We will add low-dose Remeron for sleep and appetite stimulation. Continue current mode of care, cardiology recommendations noted.  Briefly discussed with TR[facility] MD.  Also aware of CT scan of the chest  showing right upper lobe primary bronchogenic adenocarcinoma possibility. Thank you for the consult.  Code Status/Advance Care Planning:  Full code    Symptom Management:   As above  Palliative Prophylaxis:   Delirium Protocol   Psycho-social/Spiritual:   Desire for further Chaplaincy support:yes  Additional Recommendations: Caregiving  Support/Resources  Prognosis:   Unable to determine  Discharge Planning: To Be Determined      Primary Diagnoses: Present on Admission: . Acute on chronic systolic CHF (congestive heart failure) ([facility]) . GERD (gastroesophageal reflux disease) . Acute respiratory failure with hypoxia ([place]) . Moderate to severe mitral regurgitation . Multiple lung nodules on CT   I have reviewed the medical record, interviewed the patient and family, and examined the patient. The following aspects are pertinent.  Past Medical History:  Diagnosis Date  . Chronic systolic CHF (congestive heart failure) (HCC)    Social History   Socioeconomic History  . Marital status: Single    Spouse name: Not on file  . Number of children: Not on file  . Years of education: Not on file  . Highest education level: Not on file  Occupational History  . Not on file  Tobacco Use  . Smoking status: Never Smoker  . Smokeless tobacco: Never Used  Substance and Sexual Activity  . Alcohol use: Not on file  . Drug use: Not on file  . Sexual activity: Not on file  Other Topics Concern  . Not on file  Social History Narrative  . Not on file   Social Determinants of Health   Financial Resource Strain:   . Difficulty of Paying Living Expenses:   Food Insecurity:   . Worried About Charity fundraiser in the Last Year:   . Arboriculturist in the Last Year:   Transportation Needs:   . Film/video editor (Medical):   Marland Kitchen Lack of Transportation (Non-Medical):   Physical Activity:   . Days of Exercise per Week:   . Minutes of Exercise per Session:   Stress:    . Feeling of Stress :   Social Connections:   . Frequency of Communication with Friends and Family:   . Frequency of Social Gatherings with Friends and Family:   . Attends Religious Services:   . Active Member of Clubs or Organizations:   . Attends Archivist Meetings:   Marland Kitchen Marital Status:    Family History  Problem Relation Age of Onset  . CAD Mother        CABG x3 in her 52's  . Alcoholism Mother   . Heart failure Neg Hx    Scheduled Meds: . enoxaparin (LOVENOX) injection  40 mg Subcutaneous Q24H  . furosemide  40 mg Intravenous BID  . mirtazapine  7.5 mg Oral QHS  . pantoprazole  40 mg Oral Daily  . sodium chloride flush  3 mL Intravenous Q12H   Continuous Infusions: . sodium chloride     PRN Meds:.sodium chloride, acetaminophen, doxylamine (Sleep), ondansetron (ZOFRAN) IV, sodium chloride flush Medications Prior to Admission:  Prior to Admission medications   Medication Sig Start Date End Date Taking? Authorizing Provider  doxylamine, Sleep, (UNISOM) 25 MG tablet Take 50 mg by mouth at bedtime as needed for sleep.   Yes [provider]  furosemide (LASIX) 20 MG tablet Take 20 mg by mouth daily.  12/17/19  Yes [provider]  pantoprazole (PROTONIX) 40 MG tablet Take 40 mg by mouth daily. 12/08/19  Yes [provider]   No Known Allergies Review of Systems + not able to sleep at night  Physical Exam Patient admits to feeling fatigued even with minimal exertion Awake alert sitting comfortably by the side of the bed Denies shortness of breath, regular work of breathing S1-S2 mildly tachycardic, systolic murmur appreciated Abdomen is not distended nontender No edema Nonfocal  Vital Signs: BP 112/82 (BP Location: Right Arm)   Pulse (!) 104   Temp 98.2 F (36.8 C) (Oral)   Resp 19   Ht 5\' 11"  (1.803 m)   Wt 77.6 kg   SpO2 98%   BMI 23.86 kg/m  Pain Scale: 0-10   Pain Score: 0-No pain   SpO2: SpO2: 98 % O2 Device:SpO2:  98 % O2 Flow Rate: .O2 Flow Rate (L/min): 2 L/min  IO: Intake/output summary:   Intake/Output Summary (Last 24 hours) at 01/24/2020 1258 Last data filed at 01/24/2020 1200 Gross per 24 hour  Intake 483 ml  Output 2950 ml  Net -2467 ml    LBM:   Baseline Weight: Weight: 70.3 kg Most recent weight: Weight: 77.6 kg     Palliative Assessment/Data:   PPS 40%  Time In:  12 Time Out:  1300 Time Total:  60  Greater than 50%  of this time was spent counseling and coordinating care related to the above assessment and plan.  Signed by: Loistine Chance, MD   Please contact Palliative Medicine Team phone at [phone] for questions and concerns.  For individual provider: See Shea Evans

## 2020-01-24 NOTE — Progress Notes (Signed)
  Echocardiogram 2D Echocardiogram has been performed.  Randa Lynn Franck Vinal 01/24/2020, 12:51 PM

## 2020-01-24 NOTE — Consult Note (Addendum)
Cardiology Consultation:   Patient ID: Philip Campbell MRN: [id number]; DOB: Apr 09, 1951  Admit date: 01/23/2020 Date of Consult: 01/24/2020  Primary Care Provider: Patient, No Pcp Per Primary Cardiologist: New to [facility] (Dr. Acie Fredrickson) Primary Electrophysiologist:  None    Patient Profile:   Philip Campbell is a 69 y.o. male with a history of chronic systolic CHF/non-ischemic cardiomyopathy with EF of 20%, severe mitral regurgitation, pulmonary nodules, and GERD who is being seen today for the evaluation of acute on chronic CHF at the request of Dr Maryland Pink (Internal Medicine).   History of Present Illness:   Philip Campbell is a 69 year old male with the above history. Patient was admitted to [facility] in [place] in 06/2019 after presenting with shortness of breath. I am unable to personally review records at this time. However, they were summarized in ED note and H&P. During that admission, he was found to have pneumonia and multiple pulmonary nodules as well as significant CHF with EF of 20%. He reportedly had a cardiac cath at time which showed mostly normal coronary arteries. He was also told that all of his heart valves were "bad" and looks like he had severe mitral regurgitation. He was diuresed and discharged on Lasix, ACEi, and beta-blocker and told to follow-up with Cardiology. However, he ultimately had to stop the ACEi and beta-blocker due to low BPs with systolic BP reportedly less than 90 at times. Since then, he moved from [place] to [place] and has not been able to follow-up with Cardiology.   Patient presented to the [facility] ED on 01/23/2020 for evaluation of worsening shortness of breath and orthopnea. Patient was doing pretty well until about 5-6 days ago when he started to noticed significant shortness of breath mostly at night. He describes orthopnea and PND and states he has not really been able to sleep at all the last several days. No lower extremity edema but he does  report about a 30lb weight gain since 07/2019. He states that after being discharged from [facility] he got down to 140 to 145 lbs and now he is back up to 170 lbs. He also notes significant fatigue the last several days and states he just has not had any energy. He reports non-radiating substernal chest pressure associated with the shortness of breath. Non-exertional and worse when he lays down. No diaphoresis, nausea, or vomiting. On his home BP machine, he has noticed that his heart rates are often in the 100's but denies any palpitations. No lightheadedness,dizziness, or syncope. No recent fevers or illnesses.  In the ED, initially O2s were above 90's on room air. However, when patient fell asleep, sats dropped to 78% so he was placed on 2L of O2 with improvement. EKG showed normal sinus rhythm with LVH with repolarization changes and LBBB (no prior tracing available for comparison). High-sensitivity troponin minimally elevated and flat at 23 >> 28. BNP elevated at 1,845. Chest x-ray showed cardiomegaly with pulmonary vascular congestion and interstitial prominence most consistent with CHF as well as a slightly ill-defined rounded opacity within the peripheral aspect of the right upper lobe. WBC 6.6, Hgb 13.0, Plts 203. Na 138, K 4.0, Glucose 111, BUN 32, Cr 1.09. COVID-19 negative. Patient given dose of IV Lasix 20mg  and admitted for further evaluation.   At the time of this evaluation, patient resting comfortably. He reports shortness of breath has improved some with IV Lasix but breathing does sound labored when he is talking with me. He states he was actually able  to get a few hours of sleep yesterday.  He denies any smoking history or alcohol use. He reports smoking marijuana recreationally when he was in his 20's but none since. No other recreational drug use. He does have a family history of heart disease with his mother having a triple bypass in her 22's. No known family history of heart failure.    Past Medical History:  Diagnosis Date  . Chronic systolic CHF (congestive heart failure) ([place])     History reviewed. No pertinent surgical history.   Home Medications:  Prior to Admission medications   Medication Sig Start Date End Date Taking? Authorizing Provider  doxylamine, Sleep, (UNISOM) 25 MG tablet Take 50 mg by mouth at bedtime as needed for sleep.   Yes [provider]  furosemide (LASIX) 20 MG tablet Take 20 mg by mouth daily.  12/17/19  Yes [provider]  pantoprazole (PROTONIX) 40 MG tablet Take 40 mg by mouth daily. 12/08/19  Yes [provider]    Inpatient Medications: Scheduled Meds: . enoxaparin (LOVENOX) injection  40 mg Subcutaneous Q24H  . furosemide  20 mg Intravenous Q12H  . pantoprazole  40 mg Oral Daily  . sodium chloride flush  3 mL Intravenous Q12H   Continuous Infusions: . sodium chloride     PRN Meds: sodium chloride, acetaminophen, doxylamine (Sleep), ondansetron (ZOFRAN) IV, sodium chloride flush  Allergies:   No Known Allergies  Social History:   Social History   Socioeconomic History  . Marital status: Single    Spouse name: Not on file  . Number of children: Not on file  . Years of education: Not on file  . Highest education level: Not on file  Occupational History  . Not on file  Tobacco Use  . Smoking status: Never Smoker  . Smokeless tobacco: Never Used  Substance and Sexual Activity  . Alcohol use: Not on file  . Drug use: Not on file  . Sexual activity: Not on file  Other Topics Concern  . Not on file  Social History Narrative  . Not on file   Social Determinants of Health   Financial Resource Strain:   . Difficulty of Paying Living Expenses:   Food Insecurity:   . Worried About Charity fundraiser in the Last Year:   . Arboriculturist in the Last Year:   Transportation Needs:   . Film/video editor (Medical):   Marland Kitchen Lack of Transportation (Non-Medical):   Physical Activity:   . Days  of Exercise per Week:   . Minutes of Exercise per Session:   Stress:   . Feeling of Stress :   Social Connections:   . Frequency of Communication with Friends and Family:   . Frequency of Social Gatherings with Friends and Family:   . Attends Religious Services:   . Active Member of Clubs or Organizations:   . Attends Archivist Meetings:   Marland Kitchen Marital Status:   Intimate Partner Violence:   . Fear of Current or Ex-Partner:   . Emotionally Abused:   Marland Kitchen Physically Abused:   . Sexually Abused:     Family History:    Family History  Problem Relation Age of Onset  . CAD Mother        CABG x3 in her 3's  . Alcoholism Mother   . Heart failure Neg Hx      ROS:  Please see the history of present illness.  Review of Systems  Constitutional:  Positive for malaise/fatigue. Negative for diaphoresis, fever and weight loss.  HENT: Negative for congestion.   Respiratory: Positive for shortness of breath. Negative for cough.   Cardiovascular: Positive for chest pain, orthopnea and PND. Negative for palpitations and leg swelling.  Gastrointestinal: Negative for abdominal pain, blood in stool, nausea and vomiting.  Genitourinary: Negative for hematuria.  Musculoskeletal: Negative for myalgias.  Neurological: Negative for dizziness and loss of consciousness.  Endo/Heme/Allergies: Does not bruise/bleed easily.  Psychiatric/Behavioral: Negative for substance abuse.    Physical Exam/Data:   Vitals:   01/23/20 1746 01/23/20 1952 01/23/20 2350 01/24/20 0356  BP: 95/64 92/67 105/75 114/85  Pulse: 89 68 92 (!) 105  Resp: 19 (!) 22 (!) 24 (!) 24  Temp: 98 F (36.7 C) 97.6 F (36.4 C) 97.6 F (36.4 C) 97.9 F (36.6 C)  TempSrc: Oral Oral Oral Oral  SpO2: 97% 91% 93% 99%  Weight:    77.6 kg  Height:        Intake/Output Summary (Last 24 hours) at 01/24/2020 0856 Last data filed at 01/24/2020 2778 Gross per 24 hour  Intake 243 ml  Output 2075 ml  Net -1832 ml   Last 3 Weights  01/24/2020 01/23/2020 01/23/2020  Weight (lbs) 171 lb 1.6 oz 169 lb 155 lb  Weight (kg) 77.61 kg 76.658 kg 70.308 kg     Body mass index is 23.86 kg/m.  General: 69 y.o. male resting comfortably in no acute distress. HEENT: Normocephalic and atraumatic. Sclera clear.  Neck: Supple. No carotid bruits. JVD elevated. Heart: Mildly tachycardic with regular rate. Distinct S1 and S2. III/VI systolic murmur at apex. No gallops , or rubs appreciated. Radial pulses 2+ and equal bilaterally. Lungs: Mild increased work of breathing while talking. Minimal crackles noted in bilateral bases, mostly left base. Abdomen: Soft, non-distended, and non-tender to palpation. Bowel sounds present. Extremities: No lower extremity edema.    Skin: Warm and dry. Neuro: Alert and oriented x3. No focal deficits. Psych: Normal affect. Responds appropriately.   EKG:  The EKG was personally reviewed and demonstrates:  Normal sinus rhythm, rate 96 bpm, with LVH and associated repolarization changes, left atrial enlargement, left axis deviation, and LBBB. QRS 143 ms. QTc 517 ms.   Telemetry:  Telemetry was personally reviewed and demonstrates: Sinus rhythm with rates ranging from the 80's to the 110's (mostly in 90's to low 100's) and PVCs (some couplets).   Relevant CV Studies:  None available to review at this time.  Laboratory Data:  High Sensitivity Troponin:   Recent Labs  Lab 01/23/20 0837 01/23/20 1017  TROPONINIHS 23* 28*     Chemistry Recent Labs  Lab 01/23/20 0837 01/24/20 0439  NA 138 137  K 4.0 4.1  CL 109 106  CO2 18* 23  GLUCOSE 111* 111*  BUN 32* 36*  CREATININE 1.09 1.13  CALCIUM 9.0 8.8*  GFRNONAA >60 >60  GFRAA >60 >60  ANIONGAP 11 8    No results for input(s): PROT, ALBUMIN, AST, ALT, ALKPHOS, BILITOT in the last 168 hours. Hematology Recent Labs  Lab 01/23/20 0837  WBC 6.6  RBC 4.52  HGB 13.0  HCT 40.6  MCV 89.8  MCH 28.8  MCHC 32.0  RDW 14.0  PLT 203   BNP Recent  Labs  Lab 01/23/20 0837  BNP 1,845.6*    DDimer No results for input(s): DDIMER in the last 168 hours.   Radiology/Studies:  DG Chest 2 View  Result Date: 01/23/2020 CLINICAL DATA:  Shortness of  breath. History of CHF EXAM: CHEST - 2 VIEW COMPARISON:  None. FINDINGS: Cardiomegaly. Pulmonary vascular congestion and bilateral interstitial prominence. Slightly ill-defined small rounded opacity within the peripheral aspect of the right upper lobe, which may reflect an area of edema or focal infiltrate. No pleural effusion or pneumothorax. Degenerative changes of the bilateral shoulders and thoracic spine. IMPRESSION: 1. Cardiomegaly with pulmonary vascular congestion and interstitial prominence most consistent with CHF. 2. Slightly ill-defined rounded opacity within the peripheral aspect of the right upper lobe, which may reflect an area of edema or focal infiltrate. Radiographic follow-up to resolution is recommended to exclude an underlying nodule. Electronically Signed   By: Davina Poke D.O.   On: 01/23/2020 08:58   CT CHEST NODULE FOLLOW UP LOW DOSE W/O  Result Date: 01/24/2020 CLINICAL DATA:  68 year old male with history of pulmonary nodules. Follow-up study. EXAM: CT CHEST WITHOUT CONTRAST TECHNIQUE: Multidetector CT imaging of the chest was performed following the standard protocol without IV contrast. COMPARISON:  No priors. FINDINGS: Cardiovascular: Heart size is mildly enlarged. There is no significant pericardial fluid, thickening or pericardial calcification. There is aortic atherosclerosis, as well as atherosclerosis of the great vessels of the mediastinum and the coronary arteries, including calcified atherosclerotic plaque in the left main, left anterior descending and right coronary arteries. Calcifications of the aortic valve. Mediastinum/Nodes: Multiple prominent borderline enlarged mediastinal lymph nodes are noted measuring up to 1.2 cm in short axis in the low right paratracheal  nodal station, nonspecific. Esophagus is unremarkable in appearance. No axillary lymphadenopathy. Lungs/Pleura: Right upper lobe mass measuring 3.8 x 2.0 x 1.8 cm (coronal image 127 of series 4 and axial image 116 of series 3). 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 Widespread areas of interlobular septal thickening in a few patchy areas of mild ground-glass attenuation, suggesting a background of mild interstitial pulmonary edema. Trace bilateral pleural effusions. Upper Abdomen: Aortic atherosclerosis. Musculoskeletal: There are no aggressive appearing lytic or blastic lesions noted in the visualized portions of the skeleton. IMPRESSION: 1. 3.8 x 2.0 x 1.8 cm mass in the right upper lobe highly concerning for primary bronchogenic adenocarcinoma. Further evaluation with PET-CT is strongly recommended in the near future. 2. Cardiomegaly with evidence of mild interstitial pulmonary edema and trace bilateral pleural effusions; imaging findings suggestive of congestive heart failure. 3. Aortic atherosclerosis, in addition to left main and 2 vessel coronary artery disease. Please note that although the presence of coronary artery calcium documents the presence of coronary artery disease, the severity of this disease and any potential stenosis cannot be assessed on this non-gated CT examination. Assessment for potential risk factor modification, dietary therapy or pharmacologic therapy may be warranted, if clinically indicated. 4. There are calcifications of the aortic valve. Echocardiographic correlation for evaluation of potential valvular dysfunction may be warranted if clinically indicated. Aortic Atherosclerosis (ICD10-I70.0). Electronically Signed   By: Vinnie Langton M.D.   On: 01/24/2020 08:16    Assessment and Plan:   Acute on Chronic Systolic CHF with Acute Hypoxic Respiratory Failure - Patient presented  with worsening shortness of breath, orthopnea, PND, and weight gain.  - BNP elevated in the 1,800s.  - Chest x-ray consistent with CHF.  - Echo pending. - Received one dose of IV Lasix 20mg  in the ED. Documented urinary output of 1.7 L since then. Weight 171 lbs today which is up from admission weight (do not know if admission weight is accurate). Renal function stable but  BUN is elevated.  - JVD does appear elevated but does not look extremely volume overloaded nut still sounds very short of breath. Will increase IV Lasix to 40mg  twice daily and monitor BP closely.  - Patient previously has not been able to tolerate ACEi or beta-blocker due to low BPs. Will focus on diuresis right now and then may be able to work on adding back evidence based heart failure medications. - Continue to monitor daily weights, strict I/O's, and renal function.  - Will try to get a hold of outside records for personal review. He may need repeat right/left heart catheterization and work-up for severe mitral regurgitation.   Severe Mitral Regurgitation - Reportedly found to have severe MR during hospitalization in 06/2019. - Repeat Echo pending.  - Will try to obtain outside records.   Elevated Troponin - High-sensitivity troponin minimally elevated and flat at 23 >>28. Not consistent with ACS.  - EKG shows LBBB.  - He notes some chest pressure with associated shortness of breath worse when laying down. Denies anything that sounds like angina. - Reportedly had a clean cardiac catheterization in 06/2019. - Suspect demand ischemia in setting of acute on chronic CHF with respiratory failure. Echo pending. - Will try to obtain outside records (they may be in the process of being scanned into system).  Pulmonary Nodules - Follow-up CT pending. - Management per primary team.   GERD - Continue Protonix.    For questions or updates, please contact [place] Please consult [email or web address] for contact info under      Signed, Darreld Mclean, PA-C  01/24/2020 8:56 AM  Attending Note:   The patient was seen and examined.  Agree with assessment and plan as noted above.  Changes made to the above note as needed.  Patient seen and independently examined with  Sande Rives, PA .   We discussed all aspects of the encounter. I agree with the assessment and plan as stated above.  1.  Acute on chronic combined systolic and diastolic congestive heart failure: The patient has a new diagnosis of congestive heart failure.  His ejection fraction was 20% at the [facility] facility.  He also has severe mitral regurgitation reportedly.  He also has superimposed respiratory failure partially due to his bronchogenic carcinoma.    I have personally reviewed the preliminary echo images performed today at our facility.  He has markedly reduced left ventricular systolic function with an ejection fraction of 15 to 20%.  He has severe mitral regurgitation.  There is mild aortic insufficiency and mild tricuspid regurgitation.  We will continue Lasix 40 mg a day. His blood pressures fairly low so I do not think that he will tolerate Entresto.  We can try adding low-dose carvedilol to see if that might help slow his heart rate down. I try adding extremely low-dose ARB to see if he can tolerate that.  He seem fairly euvolemic at this point .    His severe LV dysfunction is complicated by his severe mitral regurgitation.  Mitral valve is extremely thick and I am not sure that he is a candidate for MitraClip.  We will need to learn a little bit more about his presumed lung cancer before we start down the path toward mitral valve repair or replacement.  It would not make sense to put him through mitral valve repair or replacement if he has a poor prognosis based on his lung cancer.   2.  Severe mitral vegetation. Has severe MR.  The MV is very thickened.  I am not sure if he is a candidate for MitraClip.  A lot of this  depends on what his prognosis is from a lung cancer standpoint.  2.  Lung cancer: I suggest pulmonary consultation.     I have spent a total of 40 minutes with patient reviewing hospital  notes , telemetry, EKGs, labs and examining patient as well as establishing an assessment and plan that was discussed with the patient. > 50% of time was spent in direct patient care.    Thayer Headings, Brooke Bonito., MD, [facility] 01/24/2020, 1:40 PM [address]  [place] Pager [phone]

## 2020-01-24 NOTE — Consult Note (Signed)
NAME:  Philip Campbell, MRN:  [id number], DOB:  [birth year], LOS: 1 ADMISSION DATE:  01/23/2020, CONSULTATION DATE:  4/1 REFERRING MD:  Delphia Grates, CHIEF COMPLAINT:  Lung mass   Brief History   RUL lung mass, never smoker Decompensated HFrEF  History of present illness   Philip Campbell is a 69 year old gentleman admitted with decompensated systolic heart failure.  He was diagnosed with heart failure several months ago during an admission at [facility], but due to difficult social situation has not followed up or been taking medications for his heart failure.  Per cardiology notes he has previously been intolerant to guideline directed medical therapy due to hypotension.  He has progressive dyspnea on exertion with rest, orthopnea, leg edema.  He is a never smoker.  No personal or family history of malignancy. No recent weigth loss.  Past Medical History  HFrEF  Significant Hospital Events     Consults:  Cardiology Palliative Care  Procedures:    Significant Diagnostic Tests:  TTE 4/1--LVEF less than 20% with severe global LV enlargement.  Moderate to severe MR.  Severely dilated LA.  Moderate RV dysfunction with mild TR.  Mildly elevated PASP.  Moderately dilated RA.  Micro Data:    Antimicrobials:    Interim history/subjective:    Objective   Blood pressure 104/81, pulse (!) 102, temperature 97.8 F (36.6 C), temperature source Oral, resp. rate 18, height 5\' 11"  (1.803 m), weight 77.6 kg, SpO2 100 %.        Intake/Output Summary (Last 24 hours) at 01/24/2020 2032 Last data filed at 01/24/2020 1900 Gross per 24 hour  Intake 603 ml  Output 3800 ml  Net -3197 ml   Filed Weights   01/23/20 0825 01/23/20 1544 01/24/20 0356  Weight: 70.3 kg 76.7 kg 77.6 kg    Examination: General: Ill-appearing elderly man sitting up in bed HENT: [place]/AT, eyes anicteric.  Dilated veins on his forehead Lungs: Bibasilar rales, tachypneic when sitting up and trying to talk Cardiovascular: Regular  rate and rhythm, JVD to angle of mandible Abdomen: Soft, nontender Extremities: No clubbing or cyanosis Neuro: Wake alert, answering questions appropriately Derm: No rashes  CT chest personally reviewed-irregular right upper lobe mass very concerning for primary lung cancer.  2 enlarged mediastinal lymph nodes.    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  Non-smoker. [facility] risk calculator 5.6% change of malignancy -Recommend biopsy to confirm diagnosis.  Discussed that endobronchial navigational biopsy and EBUS could be done as one procedure to provide diagnosis and staging, although his heart failure needs to be stabilized prior to this being completed.  Another option for a more prompt diagnosis would be an IR guided biopsy, which would provide a diagnosis but not staging.  Given his severe heart failure, I doubt that he would be a surgical candidate for lobectomy at any point in the near future.  Positive lymph nodes in the mediastinum would preclude being a surgical candidate as well. -Discussed the risks, benefits, alternatives between concurrent navigational bronchoscopy and EBUS versus transcutaneous IR biopsy of the lung.  He would prefer to do a concurrent diagnostic and staging procedure, although this would have to be delayed until he is more stable from a cardiac standpoint.  This would be done as an outpatient. -Without significant improvement in his heart failure, I am unsure what his options would be for chemotherapy. -PET scan and brain MRI may be helpful to  evaluate for metastatic disease to help with staging and potentially offer less invasive sites for biopsy to provide both staging and diagnosis.   Best practice:    Labs   CBC: Recent Labs  Lab 01/23/20 0837  WBC 6.6  NEUTROABS 4.1  HGB 13.0  HCT 40.6  MCV 89.8  PLT 185    Basic Metabolic Panel: Recent Labs  Lab 01/23/20 0837  01/24/20 0439 01/24/20 0945  NA 138 137  --   K 4.0 4.1  --   CL 109 106  --   CO2 18* 23  --   GLUCOSE 111* 111*  --   BUN 32* 36*  --   CREATININE 1.09 1.13  --   CALCIUM 9.0 8.8*  --   MG  --   --  1.8   GFR: Estimated Creatinine Clearance: 64.8 mL/min (by C-G formula based on SCr of 1.13 mg/dL). Recent Labs  Lab 01/23/20 0837  WBC 6.6    Liver Function Tests: No results for input(s): AST, ALT, ALKPHOS, BILITOT, PROT, ALBUMIN in the last 168 hours. No results for input(s): LIPASE, AMYLASE in the last 168 hours. No results for input(s): AMMONIA in the last 168 hours.  ABG No results found for: PHART, PCO2ART, PO2ART, HCO3, TCO2, ACIDBASEDEF, O2SAT   Coagulation Profile: No results for input(s): INR, PROTIME in the last 168 hours.  Cardiac Enzymes: No results for input(s): CKTOTAL, CKMB, CKMBINDEX, TROPONINI in the last 168 hours.  HbA1C: No results found for: HGBA1C  CBG: No results for input(s): GLUCAP in the last 168 hours.  Review of Systems:   + SOB, edema, abdominal distention, orthopnea - weight loss, cough  Past Medical History  He,  has a past medical history of Chronic systolic CHF (congestive heart failure) ([place]).   Surgical History   History reviewed. No pertinent surgical history.   Social History   reports that he has never smoked. He has never used smokeless tobacco.   Family History   His family history includes Alcoholism in his mother; CAD in his mother. There is no history of Heart failure.   Allergies No Known Allergies   Home Medications  Prior to Admission medications   Medication Sig Start Date End Date Taking? Authorizing Provider  doxylamine, Sleep, (UNISOM) 25 MG tablet Take 50 mg by mouth at bedtime as needed for sleep.   Yes [provider]  furosemide (LASIX) 20 MG tablet Take 20 mg by mouth daily.  12/17/19  Yes [provider]  pantoprazole (PROTONIX) 40 MG tablet Take 40 mg by mouth daily. 12/08/19  Yes  [provider]     Julian Hy, DO 01/24/20 8:32 PM [facility]

## 2020-01-25 LAB — BASIC METABOLIC PANEL
Anion gap: 10 (ref 5–15)
BUN: 34 mg/dL — ABNORMAL HIGH (ref 8–23)
CO2: 24 mmol/L (ref 22–32)
Calcium: 9 mg/dL (ref 8.9–10.3)
Chloride: 105 mmol/L (ref 98–111)
Creatinine, Ser: 1.11 mg/dL (ref 0.61–1.24)
GFR calc Af Amer: >60 mL/min (ref >60)
GFR calc non Af Amer: >60 mL/min (ref >60)
Glucose, Bld: 102 mg/dL — ABNORMAL HIGH (ref 70–99)
Potassium: 3.6 mmol/L (ref 3.5–5.1)
Sodium: 139 mmol/L (ref 135–145)

## 2020-01-25 MED ORDER — POTASSIUM CHLORIDE CRYS ER 20 MEQ PO TBCR
20.0000 meq | EXTENDED_RELEASE_TABLET | Freq: Every day | ORAL | Status: DC
  Administered 2020-01-25 – 2020-01-26 (×2): 20 meq via ORAL
  Filled 2020-01-25 (×2): qty 1

## 2020-01-25 MED ORDER — FUROSEMIDE 40 MG PO TABS
40.0000 mg | ORAL_TABLET | Freq: Every day | ORAL | Status: DC
  Administered 2020-01-25: 40 mg via ORAL
  Filled 2020-01-25 (×2): qty 1

## 2020-01-25 NOTE — Progress Notes (Signed)
Patient ID: Philip Campbell, male   DOB: 1951-05-08, 69 y.o.   MRN: [id number] Request received for possible image guided right lung mass biopsy on patient.  Latest imaging studies were reviewed by Dr. Anselm Pancoast and he feels lesion could be infection with recommendation for either bronchoscopy and/or short-term follow-up.  He would not recommend CT-guided biopsy at this time.  Dr. Cathlean Sauer notified.

## 2020-01-25 NOTE — Progress Notes (Addendum)
PROGRESS NOTE    Theodor Mustin  ZCH:[id number] DOB: [birth year] DOA: 01/23/2020 PCP: Patient, No Pcp Per    Brief Narrative:  Patient was admitted to the hospital with working diagnosis of acute on chronic decompensated systolic heart failure.  69 year old male who presented with dyspnea.  He does have significant past medical history for nonischemic cardiomyopathy, severe mitral vegetation, systolic heart failure and GERD.  Patient was recently diagnosed with heart failure, placed on heart failure regimen including afterload reducing agents.  Currently ACE inhibitor was stopped due to hypotension.  Her last 6 days he has developed worsening dyspnea, associated with orthopnea and 15 pound weight gain.  On his initial physical examination his blood pressure was 94/80, pulse rate 96, temperature 97.6, respiratory rate 24, oxygen saturation 97%, lungs were clear to auscultation bilaterally, heart S1-S2 present rhythmic, soft abdomen, no lower extremity edema.  Further work up with CT chest showed a right upper lobe mass. He has been started on aggressive diuresis for heart failure. Further heart failure management and advanced therapies will depend on results of pulmonary work up, if malignancy confirmed likely not candidate for more invasive heart failure interventions.   Patient responding well to diuresis with furosemide.   Assessment & Plan:   Principal Problem:   Acute on chronic systolic CHF (congestive heart failure) (HCC) Active Problems:   GERD (gastroesophageal reflux disease)   Non-ischemic cardiomyopathy (HCC)   Acute respiratory failure with hypoxia (HCC)   Moderate to severe mitral regurgitation   Multiple lung nodules on CT    1. Acute on chronic systolic heart failure decompensation, non ischemic cardiomyopathy. Urine output over last 24 H is 4,450 ml, blood pressure 93 mmHg systolic, stable renal function with serum cr at 1.1.  Echocardiogram with EF LV <20% with moderate  to sever mitral regurgitation. RV systolic function reduced.   Continue heart failure management with carvedilol, now transition to oral furosemide. If blood pressure tolerated may add RASS inhibition.   2. Right upper lobe mass. 3.8x 2,0x 1,8 cm.  Case discussed with Dr. Carlis Abbott from pulmonary, less invasive approach, would be IR guided CT, to avoid anesthesia and sedation, that can decompensate heart failure.  Case discussed with Dr. Anselm Pancoast team from IR, images reviewed, suspected inflammatory process, suggest bronchoscopy or follow up imaging to document potential resolution.   Patient was recently treated for pneumonia at another hospital, will order procalcitonin, hold on antibiotic therapy for now.   3. Depression.On mirtazapine.     DVT prophylaxis: enoxaparin   Code Status:  full Family Communication: no family at the bedside  Disposition Plan/ discharge barriers: patient from home barrier for dc decompensated heart failure needing IV therapies.   Consultants:   Cardiology   Pulmonary   Subjective: Patient is feeling better, but not yet back to baseline, continue to have dyspnea on exertion, no nausea or vomiting, no chest pain.   Objective: Vitals:   01/24/20 1407 01/24/20 1744 01/24/20 2040 01/25/20 0500  BP:  104/81 94/74 99/87   Pulse:  (!) 102 79 96  Resp:  18 18 18   Temp:  97.8 F (36.6 C) (!) 97.2 F (36.2 C) 97.7 F (36.5 C)  TempSrc:  Oral Oral Oral  SpO2: 96% 100% 98% 100%  Weight:    72.3 kg  Height:        Intake/Output Summary (Last 24 hours) at 01/25/2020 1104 Last data filed at 01/25/2020 0911 Gross per 24 hour  Intake 360 ml  Output 3375 ml  Net -  3015 ml   Filed Weights   01/23/20 1544 01/24/20 0356 01/25/20 0500  Weight: 76.7 kg 77.6 kg 72.3 kg    Examination:   General: Not in pain or dyspnea, deconditioned  Neurology: Awake and alert, non focal  E ENT: mild pallor, no icterus, oral mucosa moist Cardiovascular: mild JVD. S1-S2  present, rhythmic, no gallops, rubs, or positive 3/6 systolic murmurs at the apex. Trace lower extremity edema. Pulmonary: positive breath sounds bilaterally, adequate air movement, no wheezing,or rhonchi, mild rales at bases bilaterally. Gastrointestinal. Abdomen with no organomegaly, non tender, no rebound or guarding Skin. No rashes Musculoskeletal: no joint deformities     Data Reviewed: I have personally reviewed following labs and imaging studies  CBC: Recent Labs  Lab 01/23/20 0837  WBC 6.6  NEUTROABS 4.1  HGB 13.0  HCT 40.6  MCV 89.8  PLT 322   Basic Metabolic Panel: Recent Labs  Lab 01/23/20 0837 01/24/20 0439 01/24/20 0945 01/25/20 0449  NA 138 137  --  139  K 4.0 4.1  --  3.6  CL 109 106  --  105  CO2 18* 23  --  24  GLUCOSE 111* 111*  --  102*  BUN 32* 36*  --  34*  CREATININE 1.09 1.13  --  1.11  CALCIUM 9.0 8.8*  --  9.0  MG  --   --  1.8  --    GFR: Estimated Creatinine Clearance: 63.3 mL/min (by C-G formula based on SCr of 1.11 mg/dL). Liver Function Tests: No results for input(s): AST, ALT, ALKPHOS, BILITOT, PROT, ALBUMIN in the last 168 hours. No results for input(s): LIPASE, AMYLASE in the last 168 hours. No results for input(s): AMMONIA in the last 168 hours. Coagulation Profile: No results for input(s): INR, PROTIME in the last 168 hours. Cardiac Enzymes: No results for input(s): CKTOTAL, CKMB, CKMBINDEX, TROPONINI in the last 168 hours. BNP (last 3 results) No results for input(s): PROBNP in the last 8760 hours. HbA1C: No results for input(s): HGBA1C in the last 72 hours. CBG: No results for input(s): GLUCAP in the last 168 hours. Lipid Profile: No results for input(s): CHOL, HDL, LDLCALC, TRIG, CHOLHDL, LDLDIRECT in the last 72 hours. Thyroid Function Tests: No results for input(s): TSH, T4TOTAL, FREET4, T3FREE, THYROIDAB in the last 72 hours. Anemia Panel: No results for input(s): VITAMINB12, FOLATE, FERRITIN, TIBC, IRON, RETICCTPCT  in the last 72 hours.    Radiology Studies: I have reviewed all of the imaging during this hospital visit personally     Scheduled Meds: . carvedilol  3.125 mg Oral BID WC  . enoxaparin (LOVENOX) injection  40 mg Subcutaneous Q24H  . furosemide  40 mg Oral Daily  . mirtazapine  7.5 mg Oral QHS  . pantoprazole  40 mg Oral Daily  . potassium chloride  20 mEq Oral Daily  . sodium chloride flush  3 mL Intravenous Q12H   Continuous Infusions: . sodium chloride       LOS: 2 days        Warnell Rasnic [name], MD

## 2020-01-25 NOTE — Progress Notes (Signed)
Progress Note  Patient Name: Philip Campbell Date of Encounter: 01/25/2020  Primary Cardiologist: Vickki Igou   Subjective   69 year old gentleman who presented to [facility] in [place] in September with shortness of breath and was found to have significant congestive heart failure.  He was also found to have a lung nodule that is consistent with bronchogenic carcinoma.  Also has moderate to severe mitral regurgitation.  He recently moved from [facility] to [place].  He is establishing care in [place] now. Echocardiogram yesterday revealed severe LV dysfunction with an ejection fraction of less than 20%.  He has grade 3 diastolic dysfunction.  He has moderate to severe mitral regurgitation.  The mitral valve is very thickened. 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  He was started on Lasix.  He has diuresed 5 and half liters so far. I also started him on low-dose carvedilol.  Inpatient Medications    Scheduled Meds: . carvedilol  3.125 mg Oral BID WC  . enoxaparin (LOVENOX) injection  40 mg Subcutaneous Q24H  . furosemide  40 mg Intravenous Daily  . mirtazapine  7.5 mg Oral QHS  . pantoprazole  40 mg Oral Daily  . sodium chloride flush  3 mL Intravenous Q12H   Continuous Infusions: . sodium chloride     PRN Meds: sodium chloride, acetaminophen, doxylamine (Sleep), ondansetron (ZOFRAN) IV, sodium chloride flush   Vital Signs    Vitals:   01/24/20 1407 01/24/20 1744 01/24/20 2040 01/25/20 0500  BP:  104/81 94/74 99/87   Pulse:  (!) 102 79 96  Resp:  18 18 18   Temp:  97.8 F (36.6 C) (!) 97.2 F (36.2 C) 97.7 F (36.5 C)  TempSrc:  Oral Oral Oral  SpO2: 96% 100% 98% 100%  Weight:    72.3 kg  Height:        Intake/Output Summary (Last 24 hours) at 01/25/2020 0812 Last data filed at 01/25/2020 0502 Gross per 24 hour  Intake 600 ml  Output 4450 ml  Net -3850 ml   Last 3 Weights 01/25/2020 01/24/2020 01/23/2020  Weight (lbs) 159 lb 6.3 oz 171 lb 1.6 oz  169 lb  Weight (kg) 72.3 kg 77.61 kg 76.658 kg      Telemetry    NSR - Personally Reviewed  ECG     NSR  - Personally Reviewed  Physical Exam   GEN: No acute distress.   Neck: No JVD Cardiac: RRR,  2-2/2 systolic murmur  Respiratory: Clear to auscultation bilaterally. GI: Soft, nontender, non-distended  MS: No edema; No deformity. Neuro:  Nonfocal  Psych: Normal affect   Labs    High Sensitivity Troponin:   Recent Labs  Lab 01/23/20 0837 01/23/20 1017  TROPONINIHS 23* 28*      Chemistry Recent Labs  Lab 01/23/20 0837 01/24/20 0439 01/25/20 0449  NA 138 137 139  K 4.0 4.1 3.6  CL 109 106 105  CO2 18* 23 24  GLUCOSE 111* 111* 102*  BUN 32* 36* 34*  CREATININE 1.09 1.13 1.11  CALCIUM 9.0 8.8* 9.0  GFRNONAA >60 >60 >60  GFRAA >60 >60 >60  ANIONGAP 11 8 10      Hematology Recent Labs  Lab 01/23/20 0837  WBC 6.6  RBC 4.52  HGB 13.0  HCT 40.6  MCV 89.8  MCH 28.8  MCHC 32.0  RDW 14.0  PLT 203    BNP Recent Labs  Lab 01/23/20 0837  BNP 1,845.6*     DDimer No results for input(s):  DDIMER in the last 168 hours.   Radiology    DG Chest 2 View  Result Date: 01/23/2020 CLINICAL DATA:  Shortness of breath. History of CHF EXAM: CHEST - 2 VIEW COMPARISON:  None. FINDINGS: Cardiomegaly. Pulmonary vascular congestion and bilateral interstitial prominence. Slightly ill-defined small rounded opacity within the peripheral aspect of the right upper lobe, which may reflect an area of edema or focal infiltrate. No pleural effusion or pneumothorax. Degenerative changes of the bilateral shoulders and thoracic spine. IMPRESSION: 1. Cardiomegaly with pulmonary vascular congestion and interstitial prominence most consistent with CHF. 2. Slightly ill-defined rounded opacity within the peripheral aspect of the right upper lobe, which may reflect an area of edema or focal infiltrate. Radiographic follow-up to resolution is recommended to exclude an underlying nodule.  Electronically Signed   By: Davina Poke D.O.   On: 01/23/2020 08:58   ECHOCARDIOGRAM COMPLETE  Result Date: 01/24/2020    ECHOCARDIOGRAM REPORT   Patient Name:   Philip Campbell  Date of Exam: 01/24/2020 Medical Rec #:  [id number]  Height:       71.0 in Accession #:    [id number] Weight:       171.1 lb Date of Birth:  06/14/1951 BSA:          1.973 m Patient Age:    55 years   BP:           114/85 mmHg Patient Gender: M          HR:           99 bpm. Exam Location:  Inpatient Procedure: 2D Echo, Cardiac Doppler and Color Doppler Indications:    I50.23 Acute on chronic systolic (congestive) heart failure  History:        Patient has no prior history of Echocardiogram examinations.  Sonographer:    Jonelle Sidle Dance Referring Phys: [place]  1. Severe global reduction in LV systolic function; severe LVE; restrictive filling; mildly dilated aortic root (4.3 cm); mild AI: moderate to severe MR; biatrial enlargement; moderate RV dysfunction; mild TR.  2. Left ventricular ejection fraction, by estimation, is <20%. The left ventricle has severely decreased function. The left ventricle demonstrates global hypokinesis. The left ventricular internal cavity size was severely dilated. Left ventricular diastolic parameters are consistent with Grade III diastolic dysfunction (restrictive). Elevated left atrial pressure.  3. Right ventricular systolic function is moderately reduced. The right ventricular size is normal. There is mildly elevated pulmonary artery systolic pressure.  4. Left atrial size was severely dilated.  5. Right atrial size was moderately dilated.  6. The mitral valve is normal in structure. Moderate to severe mitral valve regurgitation. No evidence of mitral stenosis.  7. The aortic valve is tricuspid. Aortic valve regurgitation is mild. Mild aortic valve sclerosis is present, with no evidence of aortic valve stenosis.  8. Aortic dilatation noted. There is mild dilatation of the aortic  root measuring 43 mm.  9. The inferior vena cava is normal in size with greater than 50% respiratory variability, suggesting right atrial pressure of 3 mmHg. FINDINGS  Left Ventricle: Left ventricular ejection fraction, by estimation, is <20%. The left ventricle has severely decreased function. The left ventricle demonstrates global hypokinesis. The left ventricular internal cavity size was severely dilated. There is no left ventricular hypertrophy. Left ventricular diastolic parameters are consistent with Grade III diastolic dysfunction (restrictive). Elevated left atrial pressure. Right Ventricle: The right ventricular size is normal. Right ventricular systolic function is moderately reduced. There is  mildly elevated pulmonary artery systolic pressure. The tricuspid regurgitant velocity is 3.03 m/s, and with an assumed right atrial pressure of 3 mmHg, the estimated right ventricular systolic pressure is 32.6 mmHg. Left Atrium: Left atrial size was severely dilated. Right Atrium: Right atrial size was moderately dilated. Pericardium: Trivial pericardial effusion is present. Mitral Valve: The mitral valve is normal in structure. There is moderate thickening of the mitral valve leaflet(s). Normal mobility of the mitral valve leaflets. Mild mitral annular calcification. Moderate to severe mitral valve regurgitation. No evidence of mitral valve stenosis. Tricuspid Valve: The tricuspid valve is normal in structure. Tricuspid valve regurgitation is mild . No evidence of tricuspid stenosis. Aortic Valve: The aortic valve is tricuspid. Aortic valve regurgitation is mild. Aortic regurgitation PHT measures 441 msec. Mild aortic valve sclerosis is present, with no evidence of aortic valve stenosis. Pulmonic Valve: The pulmonic valve was normal in structure. Pulmonic valve regurgitation is trivial. No evidence of pulmonic stenosis. Aorta: Aortic dilatation noted. There is mild dilatation of the aortic root measuring 43 mm.  Venous: The inferior vena cava is normal in size with greater than 50% respiratory variability, suggesting right atrial pressure of 3 mmHg. IAS/Shunts: No atrial level shunt detected by color flow Doppler. Additional Comments: Severe global reduction in LV systolic function; severe LVE; restrictive filling; mildly dilated aortic root (4.3 cm); mild AI: moderate to severe MR; biatrial enlargement; moderate RV dysfunction; mild TR.  LEFT VENTRICLE PLAX 2D LVIDd:         6.60 cm  Diastology LVIDs:         6.20 cm  LV e' lateral:   4.46 cm/s LV PW:         1.00 cm  LV E/e' lateral: 25.4 LV IVS:        0.90 cm  LV e' medial:    4.54 cm/s LVOT diam:     2.30 cm  LV E/e' medial:  24.9 LV SV:         38 LV SV Index:   19 LVOT Area:     4.15 cm  RIGHT VENTRICLE             IVC RV Basal diam:  3.30 cm     IVC diam: 2.30 cm RV Mid diam:    3.00 cm RV S prime:     12.30 cm/s TAPSE (M-mode): 1.9 cm LEFT ATRIUM              Index       RIGHT ATRIUM           Index LA diam:        5.20 cm  2.64 cm/m  RA Area:     24.30 cm LA Vol (A2C):   159.0 ml 80.58 ml/m RA Volume:   85.10 ml  43.13 ml/m LA Vol (A4C):   101.0 ml 51.19 ml/m LA Biplane Vol: 128.0 ml 64.87 ml/m  AORTIC VALVE LVOT Vmax:   67.80 cm/s LVOT Vmean:  44.300 cm/s LVOT VTI:    0.091 m AI PHT:      441 msec  AORTA Ao Root diam: 4.30 cm Ao Asc diam:  3.80 cm MITRAL VALVE                 TRICUSPID VALVE MV Area (PHT): 2.69 cm      TR Peak grad:   36.7 mmHg MV Decel Time: 282 msec      TR Vmax:  303.00 cm/s MR Peak grad:    71.2 mmHg MR Mean grad:    46.0 mmHg   SHUNTS MR Vmax:         422.00 cm/s Systemic VTI:  0.09 m MR Vmean:        322.0 cm/s  Systemic Diam: 2.30 cm MR PISA:         1.57 cm MR PISA Eff ROA: 14 mm MR PISA Radius:  0.50 cm MV E velocity: 113.25 cm/s Kirk Ruths MD Electronically signed by Kirk Ruths MD Signature Date/Time: 01/24/2020/2:44:34 PM    Final    CT CHEST NODULE FOLLOW UP LOW DOSE W/O  Result Date: 01/24/2020 CLINICAL DATA:   69 year old male with history of pulmonary nodules. Follow-up study. EXAM: CT CHEST WITHOUT CONTRAST TECHNIQUE: Multidetector CT imaging of the chest was performed following the standard protocol without IV contrast. COMPARISON:  No priors. FINDINGS: Cardiovascular: Heart size is mildly enlarged. There is no significant pericardial fluid, thickening or pericardial calcification. There is aortic atherosclerosis, as well as atherosclerosis of the great vessels of the mediastinum and the coronary arteries, including calcified atherosclerotic plaque in the left main, left anterior descending and right coronary arteries. Calcifications of the aortic valve. Mediastinum/Nodes: Multiple prominent borderline enlarged mediastinal lymph nodes are noted measuring up to 1.2 cm in short axis in the low right paratracheal nodal station, nonspecific. Esophagus is unremarkable in appearance. No axillary lymphadenopathy. Lungs/Pleura: Right upper lobe mass measuring 3.8 x 2.0 x 1.8 cm (coronal image 127 of series 4 and axial image 116 of series 3). 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 Widespread areas of interlobular septal thickening in a few patchy areas of mild ground-glass attenuation, suggesting a background of mild interstitial pulmonary edema. Trace bilateral pleural effusions. Upper Abdomen: Aortic atherosclerosis. Musculoskeletal: There are no aggressive appearing lytic or blastic lesions noted in the visualized portions of the skeleton. IMPRESSION: 1. 3.8 x 2.0 x 1.8 cm mass in the right upper lobe highly concerning for primary bronchogenic adenocarcinoma. Further evaluation with PET-CT is strongly recommended in the near future. 2. Cardiomegaly with evidence of mild interstitial pulmonary edema and trace bilateral pleural effusions; imaging findings suggestive of congestive heart failure. 3. Aortic atherosclerosis, in  addition to left main and 2 vessel coronary artery disease. Please note that although the presence of coronary artery calcium documents the presence of coronary artery disease, the severity of this disease and any potential stenosis cannot be assessed on this non-gated CT examination. Assessment for potential risk factor modification, dietary therapy or pharmacologic therapy may be warranted, if clinically indicated. 4. There are calcifications of the aortic valve. Echocardiographic correlation for evaluation of potential valvular dysfunction may be warranted if clinically indicated. Aortic Atherosclerosis (ICD10-I70.0). Electronically Signed   By: Vinnie Langton M.D.   On: 01/24/2020 08:16    Cardiac Studies     Patient Profile     69 y.o. male with acute on chronic  systolic and diastolic congestive heart failure and lung mass consistent with lung cancer.  Assessment & Plan   1.   Acute on chronic combined systolic and diastolic congestive heart failure: Patient is responded quite well to Lasix.  Transition him to oral Lasix today.  He has been up ambulating halls without significant shortness of breath. He is on Lasix 40 mg a day.  I have added potassium chloride 20 mEq a day  Blood pressure is borderline and I do not think he would  tolerate addition of Entresto for the today.  I have added low-dose carvedilol which he seems to be tolerating well.  Anticipate he may be discharged in the next several days depending on what the pulmonary team wants to do about this lung mass.  From a cardiac standpoint he is very stable and can be discharged follow-up with him in the office in several weeks.  2.  Severe mitral vegetation: His mitral regurgitation may improve as his heart failure improves.  Mitral is very thick.  We will need to work that up further as an outpatient.  3.  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  The pulmonary team for this  issue.      For questions or updates, please contact [place] Please consult [email or web address] for contact info under        Signed, Mertie Moores, MD  01/25/2020, 8:12 AM

## 2020-01-25 NOTE — Progress Notes (Signed)
Patients BP 92/60 manually.  Patient denies any s/s.  MD made aware, coreg held for now.

## 2020-01-25 NOTE — Care Management Important Message (Signed)
Important Message  Patient Details IM Letter given to Evette Cristal SW Case Manager to present to the Patient Name: Philip Campbell MRN: [id number] Date of Birth: 07-09-1951   Medicare Important Message Given:  Yes     Kerin Salen 01/25/2020, 10:09 AM

## 2020-01-26 DIAGNOSIS — I5023 Acute on chronic systolic (congestive) heart failure: Secondary | ICD-10-CM | POA: Diagnosis not present

## 2020-01-26 DIAGNOSIS — K219 Gastro-esophageal reflux disease without esophagitis: Secondary | ICD-10-CM

## 2020-01-26 LAB — BASIC METABOLIC PANEL
Anion gap: 11 (ref 5–15)
BUN: 37 mg/dL — ABNORMAL HIGH (ref 8–23)
CO2: 23 mmol/L (ref 22–32)
Calcium: 9 mg/dL (ref 8.9–10.3)
Chloride: 105 mmol/L (ref 98–111)
Creatinine, Ser: 1.07 mg/dL (ref 0.61–1.24)
GFR calc Af Amer: >60 mL/min (ref >60)
GFR calc non Af Amer: >60 mL/min (ref >60)
Glucose, Bld: 99 mg/dL (ref 70–99)
Potassium: 3.6 mmol/L (ref 3.5–5.1)
Sodium: 139 mmol/L (ref 135–145)

## 2020-01-26 LAB — PROCALCITONIN: Procalcitonin: <0.1 ng/mL

## 2020-01-26 MED ORDER — FUROSEMIDE 40 MG PO TABS: 40.0000 mg | ORAL_TABLET | Freq: Every day | ORAL | 0 refills | Status: DC

## 2020-01-26 MED ORDER — CARVEDILOL 3.125 MG PO TABS: 3.1250 mg | ORAL_TABLET | Freq: Two times a day (BID) | ORAL | 0 refills | Status: DC

## 2020-01-26 MED ORDER — POTASSIUM CHLORIDE CRYS ER 20 MEQ PO TBCR: 20.0000 meq | EXTENDED_RELEASE_TABLET | Freq: Every day | ORAL | 0 refills | Status: DC

## 2020-01-26 NOTE — Discharge Summary (Signed)
Physician Discharge Summary  Thor Nannini OZD:[id number] DOB: 01/24/1951 DOA: 01/23/2020  PCP: Patient, No Pcp Per  Admit date: 01/23/2020 Discharge date: 01/26/2020  Admitted From: Home  Disposition:  Home   Recommendations for Outpatient Follow-up and new medication changes:  1. Follow up with Primary Care in 7 days.  2. Patient will need follow up CT chest in 2 to 4 weeks, for follow up on right upper lung mass.  3. Patient has been placed on carvedilol and furosemide for heart failure management. 4. Further renin angiotensin aldosterone  inhibition to be started as outpatient as tolerated. 5.  Patient received heart failure teaching    Home Health: no   Equipment/Devices: no   Discharge Condition: stable   CODE STATUS: full  Diet recommendation: heart healthy and sodium restriction 2 grams per day.   Brief/Interim Summary: Patient was admitted to the hospital with working diagnosis of acute on chronic decompensated systolic heart failure.  69 year old male who presented with dyspnea. He does have significant past medical history for nonischemic cardiomyopathy, severe mitral vegetation, systolic heart failure and GERD.Patient was recently diagnosed with heart failure, placed on heart failure regimen including afterload reducing agents. Currently ACE inhibitor was stopped due to hypotension. Over the last 6 days prior to hospitalization he developed worsening dyspnea, associated with orthopnea and 15 pound weight gain. On his initial physical examination his blood pressure was 94/80, pulse rate 96, temperature 97.6, respiratory rate 24, oxygen saturation 97%, lungs were clear to auscultation bilaterally, heart S1-S2 present rhythmic, soft abdomen, no lower extremity edema.  Sodium 138, potassium 4.0, chloride 109, bicarb 18, glucose 111, BUN 32, creatinine 1.09, BNP 1845, troponin I 23, white count 6.6, hemoglobin 13.0, hematocrit 40.6, platelets 203. Chest radiograph with bilateral  interstitial infiltrates, hilar vascular congestion, ill-defined round opacity right upper lobe. EKG 96 bpm, left axis deviation, left bundle branch block, sinus rhythm with left atrial enlargement, V6 ST depression and T wave version.  He has treated with aggressive diuresis for heart failure. Echocardiogram  showed reduced LV systolic function and severe mitral regurgitation.  1.  Acute on chronic systolic heart failure decompensation, nonischemic cardiomyopathy, severe mitral regurgitation.  Patient was admitted to telemetry ward, he received aggressive diuresis with intravenous furosemide, negative fluid balance was achieved, -6407 mL since admission, with significant improvement of his symptoms.   Further work-up with echocardiography revealed a significant decrease LV systolic function less than 20% with moderate to severe mitral rotation, the RV systolic function also was reduced.  Patient was started on carvedilol with good toleration.  Further renin angiotensin aldosterone inhibition will be started as an outpatient as tolerated.  Patient received heart failure teaching.  Advanced heart failure therapies will depend on results of pulmonary work-up for right upper lung mass.  2.  Right upper lobe mass 3.8 x 2.0 x 1.8.  Patient underwent further imaging with a CT chest which showed a right upper lobe mass.   Pulmonary was consulted and considering patient's advanced heart failure it was recommended to avoid inpatient bronchoscopy until cardiovascular condition improves.  Interventional radiology was consulted for possible CT guided biopsy, images were reviewed, possible infectious process, it was recommended either bronchoscopy or radiographic follow-up.  I discussed with Mr. Budney his options, the need to rule out pulmonary malignancy, and he has decided to have a radiographic follow-up at this point.  His procalcitonin was negative, no further antibiotics were prescribed.  Note that patient did  have a recent episode of pneumonia.  3.  Depression.  Patient did receive mirtazapine while hospitalized    Discharge Diagnoses:  Principal Problem:   Acute on chronic systolic CHF (congestive heart failure) (HCC) Active Problems:   GERD (gastroesophageal reflux disease)   Non-ischemic cardiomyopathy (HCC)   Acute respiratory failure with hypoxia (HCC)   Moderate to severe mitral regurgitation   Multiple lung nodules on CT    Discharge Instructions   Allergies as of 01/26/2020   No Known Allergies     Medication List    TAKE these medications   carvedilol 3.125 MG tablet Commonly known as: COREG Take 1 tablet (3.125 mg total) by mouth 2 (two) times daily with a meal.   doxylamine (Sleep) 25 MG tablet Commonly known as: UNISOM Take 50 mg by mouth at bedtime as needed for sleep.   furosemide 40 MG tablet Commonly known as: LASIX Take 1 tablet (40 mg total) by mouth daily. What changed:   medication strength  how much to take   pantoprazole 40 MG tablet Commonly known as: PROTONIX Take 40 mg by mouth daily.   potassium chloride SA 20 MEQ tablet Commonly known as: KLOR-CON Take 1 tablet (20 mEq total) by mouth daily.       No Known Allergies  Consultations:  Cardiology  Pulmonary   Interventional Radiology    Procedures/Studies: DG Chest 2 View  Result Date: 01/23/2020 CLINICAL DATA:  Shortness of breath. History of CHF EXAM: CHEST - 2 VIEW COMPARISON:  None. FINDINGS: Cardiomegaly. Pulmonary vascular congestion and bilateral interstitial prominence. Slightly ill-defined small rounded opacity within the peripheral aspect of the right upper lobe, which may reflect an area of edema or focal infiltrate. No pleural effusion or pneumothorax. Degenerative changes of the bilateral shoulders and thoracic spine. IMPRESSION: 1. Cardiomegaly with pulmonary vascular congestion and interstitial prominence most consistent with CHF. 2. Slightly ill-defined rounded  opacity within the peripheral aspect of the right upper lobe, which may reflect an area of edema or focal infiltrate. Radiographic follow-up to resolution is recommended to exclude an underlying nodule. Electronically Signed   By: Davina Poke D.O.   On: 01/23/2020 08:58   ECHOCARDIOGRAM COMPLETE  Result Date: 01/24/2020    ECHOCARDIOGRAM REPORT   Patient Name:   [facility]  Date of Exam: 01/24/2020 Medical Rec #:  [id number]  Height:       71.0 in Accession #:    [id number] Weight:       171.1 lb Date of Birth:  April 18, 1951 BSA:          1.973 m Patient Age:    25 years   BP:           114/85 mmHg Patient Gender: M          HR:           99 bpm. Exam Location:  Inpatient Procedure: 2D Echo, Cardiac Doppler and Color Doppler Indications:    I50.23 Acute on chronic systolic (congestive) heart failure  History:        Patient has no prior history of Echocardiogram examinations.  Sonographer:    Jonelle Sidle Dance Referring Phys: [place]  1. Severe global reduction in LV systolic function; severe LVE; restrictive filling; mildly dilated aortic root (4.3 cm); mild AI: moderate to severe MR; biatrial enlargement; moderate RV dysfunction; mild TR.  2. Left ventricular ejection fraction, by estimation, is <20%. The left ventricle has severely decreased function. The left ventricle demonstrates global hypokinesis. The left ventricular internal cavity size was  severely dilated. Left ventricular diastolic parameters are consistent with Grade III diastolic dysfunction (restrictive). Elevated left atrial pressure.  3. Right ventricular systolic function is moderately reduced. The right ventricular size is normal. There is mildly elevated pulmonary artery systolic pressure.  4. Left atrial size was severely dilated.  5. Right atrial size was moderately dilated.  6. The mitral valve is normal in structure. Moderate to severe mitral valve regurgitation. No evidence of mitral stenosis.  7. The aortic valve  is tricuspid. Aortic valve regurgitation is mild. Mild aortic valve sclerosis is present, with no evidence of aortic valve stenosis.  8. Aortic dilatation noted. There is mild dilatation of the aortic root measuring 43 mm.  9. The inferior vena cava is normal in size with greater than 50% respiratory variability, suggesting right atrial pressure of 3 mmHg. FINDINGS  Left Ventricle: Left ventricular ejection fraction, by estimation, is <20%. The left ventricle has severely decreased function. The left ventricle demonstrates global hypokinesis. The left ventricular internal cavity size was severely dilated. There is no left ventricular hypertrophy. Left ventricular diastolic parameters are consistent with Grade III diastolic dysfunction (restrictive). Elevated left atrial pressure. Right Ventricle: The right ventricular size is normal. Right ventricular systolic function is moderately reduced. There is mildly elevated pulmonary artery systolic pressure. The tricuspid regurgitant velocity is 3.03 m/s, and with an assumed right atrial pressure of 3 mmHg, the estimated right ventricular systolic pressure is 96.2 mmHg. Left Atrium: Left atrial size was severely dilated. Right Atrium: Right atrial size was moderately dilated. Pericardium: Trivial pericardial effusion is present. Mitral Valve: The mitral valve is normal in structure. There is moderate thickening of the mitral valve leaflet(s). Normal mobility of the mitral valve leaflets. Mild mitral annular calcification. Moderate to severe mitral valve regurgitation. No evidence of mitral valve stenosis. Tricuspid Valve: The tricuspid valve is normal in structure. Tricuspid valve regurgitation is mild . No evidence of tricuspid stenosis. Aortic Valve: The aortic valve is tricuspid. Aortic valve regurgitation is mild. Aortic regurgitation PHT measures 441 msec. Mild aortic valve sclerosis is present, with no evidence of aortic valve stenosis. Pulmonic Valve: The pulmonic  valve was normal in structure. Pulmonic valve regurgitation is trivial. No evidence of pulmonic stenosis. Aorta: Aortic dilatation noted. There is mild dilatation of the aortic root measuring 43 mm. Venous: The inferior vena cava is normal in size with greater than 50% respiratory variability, suggesting right atrial pressure of 3 mmHg. IAS/Shunts: No atrial level shunt detected by color flow Doppler. Additional Comments: Severe global reduction in LV systolic function; severe LVE; restrictive filling; mildly dilated aortic root (4.3 cm); mild AI: moderate to severe MR; biatrial enlargement; moderate RV dysfunction; mild TR.  LEFT VENTRICLE PLAX 2D LVIDd:         6.60 cm  Diastology LVIDs:         6.20 cm  LV e' lateral:   4.46 cm/s LV PW:         1.00 cm  LV E/e' lateral: 25.4 LV IVS:        0.90 cm  LV e' medial:    4.54 cm/s LVOT diam:     2.30 cm  LV E/e' medial:  24.9 LV SV:         38 LV SV Index:   19 LVOT Area:     4.15 cm  RIGHT VENTRICLE             IVC RV Basal diam:  3.30 cm     IVC  diam: 2.30 cm RV Mid diam:    3.00 cm RV S prime:     12.30 cm/s TAPSE (M-mode): 1.9 cm LEFT ATRIUM              Index       RIGHT ATRIUM           Index LA diam:        5.20 cm  2.64 cm/m  RA Area:     24.30 cm LA Vol (A2C):   159.0 ml 80.58 ml/m RA Volume:   85.10 ml  43.13 ml/m LA Vol (A4C):   101.0 ml 51.19 ml/m LA Biplane Vol: 128.0 ml 64.87 ml/m  AORTIC VALVE LVOT Vmax:   67.80 cm/s LVOT Vmean:  44.300 cm/s LVOT VTI:    0.091 m AI PHT:      441 msec  AORTA Ao Root diam: 4.30 cm Ao Asc diam:  3.80 cm MITRAL VALVE                 TRICUSPID VALVE MV Area (PHT): 2.69 cm      TR Peak grad:   36.7 mmHg MV Decel Time: 282 msec      TR Vmax:        303.00 cm/s MR Peak grad:    71.2 mmHg MR Mean grad:    46.0 mmHg   SHUNTS MR Vmax:         422.00 cm/s Systemic VTI:  0.09 m MR Vmean:        322.0 cm/s  Systemic Diam: 2.30 cm MR PISA:         1.57 cm MR PISA Eff ROA: 14 mm MR PISA Radius:  0.50 cm MV E velocity: 113.25  cm/s Kirk Ruths MD Electronically signed by Kirk Ruths MD Signature Date/Time: 01/24/2020/2:44:34 PM    Final    CT CHEST NODULE FOLLOW UP LOW DOSE W/O  Result Date: 01/24/2020 CLINICAL DATA:  69 year old male with history of pulmonary nodules. Follow-up study. EXAM: CT CHEST WITHOUT CONTRAST TECHNIQUE: Multidetector CT imaging of the chest was performed following the standard protocol without IV contrast. COMPARISON:  No priors. FINDINGS: Cardiovascular: Heart size is mildly enlarged. There is no significant pericardial fluid, thickening or pericardial calcification. There is aortic atherosclerosis, as well as atherosclerosis of the great vessels of the mediastinum and the coronary arteries, including calcified atherosclerotic plaque in the left main, left anterior descending and right coronary arteries. Calcifications of the aortic valve. Mediastinum/Nodes: Multiple prominent borderline enlarged mediastinal lymph nodes are noted measuring up to 1.2 cm in short axis in the low right paratracheal nodal station, nonspecific. Esophagus is unremarkable in appearance. No axillary lymphadenopathy. Lungs/Pleura: Right upper lobe mass measuring 3.8 x 2.0 x 1.8 cm (coronal image 127 of series 4 and axial image 116 of series 3). 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 Widespread areas of interlobular septal thickening in a few patchy areas of mild ground-glass attenuation, suggesting a background of mild interstitial pulmonary edema. Trace bilateral pleural effusions. Upper Abdomen: Aortic atherosclerosis. Musculoskeletal: There are no aggressive appearing lytic or blastic lesions noted in the visualized portions of the skeleton. IMPRESSION: 1. 3.8 x 2.0 x 1.8 cm mass in the right upper lobe highly concerning for primary bronchogenic adenocarcinoma. Further evaluation with PET-CT is strongly recommended in the near  future. 2. Cardiomegaly with evidence of mild interstitial pulmonary edema and trace bilateral pleural effusions; imaging findings suggestive of congestive heart failure.  3. Aortic atherosclerosis, in addition to left main and 2 vessel coronary artery disease. Please note that although the presence of coronary artery calcium documents the presence of coronary artery disease, the severity of this disease and any potential stenosis cannot be assessed on this non-gated CT examination. Assessment for potential risk factor modification, dietary therapy or pharmacologic therapy may be warranted, if clinically indicated. 4. There are calcifications of the aortic valve. Echocardiographic correlation for evaluation of potential valvular dysfunction may be warranted if clinically indicated. Aortic Atherosclerosis (ICD10-I70.0). Electronically Signed   By: Vinnie Langton M.D.   On: 01/24/2020 08:16      Procedures:   Subjective: Patient is feeling better, his dyspnea has improved and he has been ambulating in the hallway. No nausea or vomiting, no cough or chest pain.   Discharge Exam: Vitals:   01/26/20 0042 01/26/20 0502  BP: 139/87 107/80  Pulse: 72 92  Resp: 18 18  Temp: 99.4 F (37.4 C) (!) 97.4 F (36.3 C)  SpO2: 100% 95%   Vitals:   01/25/20 2023 01/26/20 0042 01/26/20 0502 01/26/20 0600  BP: 109/80 139/87 107/80   Pulse: 91 72 92   Resp: 20 18 18    Temp: 97.6 F (36.4 C) 99.4 F (37.4 C) (!) 97.4 F (36.3 C)   TempSrc: Oral Oral Oral   SpO2: 92% 100% 95%   Weight:    73 kg  Height:        General: Not in pain or dyspnea.  Neurology: Awake and alert, non focal  E ENT: no pallor, no icterus, oral mucosa moist Cardiovascular: No JVD. S1-S2 present, rhythmic, no gallops, rubs, or murmurs. No lower extremity edema. Pulmonary: positive breath sounds bilaterally, adequate air movement, no wheezing, rhonchi or rales. Gastrointestinal. Abdomen with no organomegaly, non tender, no  rebound or guarding Skin. No rashes Musculoskeletal: no joint deformities   The results of significant diagnostics from this hospitalization (including imaging, microbiology, ancillary and laboratory) are listed below for reference.     Microbiology: Recent Results (from the past 240 hour(s))  SARS CORONAVIRUS 2 (TAT 6-24 HRS) Nasopharyngeal Nasopharyngeal Swab     Status: None   Collection Time: 01/23/20 10:21 AM   Specimen: Nasopharyngeal Swab  Result Value Ref Range Status   SARS Coronavirus 2 NEGATIVE NEGATIVE Final    Comment: (NOTE) SARS-CoV-2 target nucleic acids are NOT DETECTED. The SARS-CoV-2 RNA is generally detectable in upper and lower respiratory specimens during the acute phase of infection. Negative results do not preclude SARS-CoV-2 infection, do not rule out co-infections with other pathogens, and should not be used as the sole basis for treatment or other patient management decisions. Negative results must be combined with clinical observations, patient history, and epidemiological information. The expected result is Negative. Fact Sheet for Patients: SugarRoll.be Fact Sheet for Healthcare Providers: https://www.woods-mathews.com/ This test is not yet approved or cleared by the [place] FDA and  has been authorized for detection and/or diagnosis of SARS-CoV-2 by FDA under an Emergency Use Authorization (EUA). This EUA will remain  in effect (meaning this test can be used) for the duration of the COVID-19 declaration under Section 56 4(b)(1) of the Act, 21 U.S.C. section 360bbb-3(b)(1), unless the authorization is terminated or revoked sooner. Performed at [facility], [place] [address] [place], [place] [address]      Labs: BNP (last 3 results) Recent Labs    01/23/20 0837  BNP 5,726.2*   Basic Metabolic Panel: Recent Labs  Lab 01/23/20  4098 01/24/20 0439 01/24/20 0945 01/25/20 0449  01/26/20 0458  NA 138 137  --  139 139  K 4.0 4.1  --  3.6 3.6  CL 109 106  --  105 105  CO2 18* 23  --  24 23  GLUCOSE 111* 111*  --  102* 99  BUN 32* 36*  --  34* 37*  CREATININE 1.09 1.13  --  1.11 1.07  CALCIUM 9.0 8.8*  --  9.0 9.0  MG  --   --  1.8  --   --    Liver Function Tests: No results for input(s): AST, ALT, ALKPHOS, BILITOT, PROT, ALBUMIN in the last 168 hours. No results for input(s): LIPASE, AMYLASE in the last 168 hours. No results for input(s): AMMONIA in the last 168 hours. CBC: Recent Labs  Lab 01/23/20 0837  WBC 6.6  NEUTROABS 4.1  HGB 13.0  HCT 40.6  MCV 89.8  PLT 203   Cardiac Enzymes: No results for input(s): CKTOTAL, CKMB, CKMBINDEX, TROPONINI in the last 168 hours. BNP: Invalid input(s): POCBNP CBG: No results for input(s): GLUCAP in the last 168 hours. D-Dimer No results for input(s): DDIMER in the last 72 hours. Hgb A1c No results for input(s): HGBA1C in the last 72 hours. Lipid Profile No results for input(s): CHOL, HDL, LDLCALC, TRIG, CHOLHDL, LDLDIRECT in the last 72 hours. Thyroid function studies No results for input(s): TSH, T4TOTAL, T3FREE, THYROIDAB in the last 72 hours.  Invalid input(s): FREET3 Anemia work up No results for input(s): VITAMINB12, FOLATE, FERRITIN, TIBC, IRON, RETICCTPCT in the last 72 hours. Urinalysis No results found for: COLORURINE, APPEARANCEUR, [place], [place], [place], [place], [place], [place], PROTEINUR, UROBILINOGEN, NITRITE, LEUKOCYTESUR Sepsis Labs Invalid input(s): PROCALCITONIN,  WBC,  LACTICIDVEN Microbiology Recent Results (from the past 240 hour(s))  SARS CORONAVIRUS 2 (TAT 6-24 HRS) Nasopharyngeal Nasopharyngeal Swab     Status: None   Collection Time: 01/23/20 10:21 AM   Specimen: Nasopharyngeal Swab  Result Value Ref Range Status   SARS Coronavirus 2 NEGATIVE NEGATIVE Final    Comment: (NOTE) SARS-CoV-2 target nucleic acids are NOT DETECTED. The SARS-CoV-2 RNA is generally  detectable in upper and lower respiratory specimens during the acute phase of infection. Negative results do not preclude SARS-CoV-2 infection, do not rule out co-infections with other pathogens, and should not be used as the sole basis for treatment or other patient management decisions. Negative results must be combined with clinical observations, patient history, and epidemiological information. The expected result is Negative. Fact Sheet for Patients: SugarRoll.be Fact Sheet for Healthcare Providers: https://www.woods-mathews.com/ This test is not yet approved or cleared by the [place] FDA and  has been authorized for detection and/or diagnosis of SARS-CoV-2 by FDA under an Emergency Use Authorization (EUA). This EUA will remain  in effect (meaning this test can be used) for the duration of the COVID-19 declaration under Section 56 4(b)(1) of the Act, 21 U.S.C. section 360bbb-3(b)(1), unless the authorization is terminated or revoked sooner. Performed at [facility], [place] [address], [place], [place] [address]      Time coordinating discharge: 45 minutes  SIGNED:   Tawni Millers, MD  [facility] 01/26/2020, 9:21 AM

## 2020-01-26 NOTE — Progress Notes (Signed)
Progress Note  Patient Name: Philip Campbell Date of Encounter: 01/26/2020  Primary Cardiologist: Dr Acie Fredrickson  Subjective   No CP; dyspnea improved  Inpatient Medications    Scheduled Meds: . carvedilol  3.125 mg Oral BID WC  . enoxaparin (LOVENOX) injection  40 mg Subcutaneous Q24H  . furosemide  40 mg Oral Daily  . mirtazapine  7.5 mg Oral QHS  . pantoprazole  40 mg Oral Daily  . potassium chloride  20 mEq Oral Daily  . sodium chloride flush  3 mL Intravenous Q12H   Continuous Infusions: . sodium chloride     PRN Meds: sodium chloride, acetaminophen, doxylamine (Sleep), ondansetron (ZOFRAN) IV, sodium chloride flush   Vital Signs    Vitals:   01/25/20 2023 01/26/20 0042 01/26/20 0502 01/26/20 0600  BP: 109/80 139/87 107/80   Pulse: 91 72 92   Resp: 20 18 18    Temp: 97.6 F (36.4 C) 99.4 F (37.4 C) (!) 97.4 F (36.3 C)   TempSrc: Oral Oral Oral   SpO2: 92% 100% 95%   Weight:    73 kg  Height:        Intake/Output Summary (Last 24 hours) at 01/26/2020 0725 Last data filed at 01/25/2020 1621 Gross per 24 hour  Intake -  Output 850 ml  Net -850 ml   Last 3 Weights 01/26/2020 01/25/2020 01/24/2020  Weight (lbs) 160 lb 15 oz 159 lb 6.3 oz 171 lb 1.6 oz  Weight (kg) 73 kg 72.3 kg 77.61 kg      Telemetry    Sinus with PVCs- Personally Reviewed  Physical Exam   GEN: No acute distress.   Neck: No JVD Cardiac: RRR, no murmurs, rubs, or gallops.  Respiratory: Clear to auscultation bilaterally. GI: Soft, nontender, non-distended  MS: No edema Neuro:  Nonfocal  Psych: Normal affect   Labs    High Sensitivity Troponin:   Recent Labs  Lab 01/23/20 0837 01/23/20 1017  TROPONINIHS 23* 28*      Chemistry Recent Labs  Lab 01/24/20 0439 01/25/20 0449 01/26/20 0458  NA 137 139 139  K 4.1 3.6 3.6  CL 106 105 105  CO2 23 24 23   GLUCOSE 111* 102* 99  BUN 36* 34* 37*  CREATININE 1.13 1.11 1.07  CALCIUM 8.8* 9.0 9.0  GFRNONAA >60 >60 >60  GFRAA >60 >60 >60   ANIONGAP 8 10 11      Hematology Recent Labs  Lab 01/23/20 0837  WBC 6.6  RBC 4.52  HGB 13.0  HCT 40.6  MCV 89.8  MCH 28.8  MCHC 32.0  RDW 14.0  PLT 203    BNP Recent Labs  Lab 01/23/20 0837  BNP 1,845.6*    Radiology    ECHOCARDIOGRAM COMPLETE  Result Date: 01/24/2020    ECHOCARDIOGRAM REPORT   Patient Name:   [facility]  Date of Exam: 01/24/2020 Medical Rec #:  [id number]  Height:       71.0 in Accession #:    [id number] Weight:       171.1 lb Date of Birth:  [birth year] BSA:          1.973 m Patient Age:    69 years   BP:           114/85 mmHg Patient Gender: M          HR:           99 bpm. Exam Location:  Inpatient Procedure: 2D Echo, Cardiac Doppler and Color Doppler Indications:  I50.23 Acute on chronic systolic (congestive) heart failure  History:        Patient has no prior history of Echocardiogram examinations.  Sonographer:    Jonelle Sidle Dance Referring Phys: [place]  1. Severe global reduction in LV systolic function; severe LVE; restrictive filling; mildly dilated aortic root (4.3 cm); mild AI: moderate to severe MR; biatrial enlargement; moderate RV dysfunction; mild TR.  2. Left ventricular ejection fraction, by estimation, is <20%. The left ventricle has severely decreased function. The left ventricle demonstrates global hypokinesis. The left ventricular internal cavity size was severely dilated. Left ventricular diastolic parameters are consistent with Grade III diastolic dysfunction (restrictive). Elevated left atrial pressure.  3. Right ventricular systolic function is moderately reduced. The right ventricular size is normal. There is mildly elevated pulmonary artery systolic pressure.  4. Left atrial size was severely dilated.  5. Right atrial size was moderately dilated.  6. The mitral valve is normal in structure. Moderate to severe mitral valve regurgitation. No evidence of mitral stenosis.  7. The aortic valve is tricuspid. Aortic valve  regurgitation is mild. Mild aortic valve sclerosis is present, with no evidence of aortic valve stenosis.  8. Aortic dilatation noted. There is mild dilatation of the aortic root measuring 43 mm.  9. The inferior vena cava is normal in size with greater than 50% respiratory variability, suggesting right atrial pressure of 3 mmHg. FINDINGS  Left Ventricle: Left ventricular ejection fraction, by estimation, is <20%. The left ventricle has severely decreased function. The left ventricle demonstrates global hypokinesis. The left ventricular internal cavity size was severely dilated. There is no left ventricular hypertrophy. Left ventricular diastolic parameters are consistent with Grade III diastolic dysfunction (restrictive). Elevated left atrial pressure. Right Ventricle: The right ventricular size is normal. Right ventricular systolic function is moderately reduced. There is mildly elevated pulmonary artery systolic pressure. The tricuspid regurgitant velocity is 3.03 m/s, and with an assumed right atrial pressure of 3 mmHg, the estimated right ventricular systolic pressure is 74.2 mmHg. Left Atrium: Left atrial size was severely dilated. Right Atrium: Right atrial size was moderately dilated. Pericardium: Trivial pericardial effusion is present. Mitral Valve: The mitral valve is normal in structure. There is moderate thickening of the mitral valve leaflet(s). Normal mobility of the mitral valve leaflets. Mild mitral annular calcification. Moderate to severe mitral valve regurgitation. No evidence of mitral valve stenosis. Tricuspid Valve: The tricuspid valve is normal in structure. Tricuspid valve regurgitation is mild . No evidence of tricuspid stenosis. Aortic Valve: The aortic valve is tricuspid. Aortic valve regurgitation is mild. Aortic regurgitation PHT measures 441 msec. Mild aortic valve sclerosis is present, with no evidence of aortic valve stenosis. Pulmonic Valve: The pulmonic valve was normal in  structure. Pulmonic valve regurgitation is trivial. No evidence of pulmonic stenosis. Aorta: Aortic dilatation noted. There is mild dilatation of the aortic root measuring 43 mm. Venous: The inferior vena cava is normal in size with greater than 50% respiratory variability, suggesting right atrial pressure of 3 mmHg. IAS/Shunts: No atrial level shunt detected by color flow Doppler. Additional Comments: Severe global reduction in LV systolic function; severe LVE; restrictive filling; mildly dilated aortic root (4.3 cm); mild AI: moderate to severe MR; biatrial enlargement; moderate RV dysfunction; mild TR.  LEFT VENTRICLE PLAX 2D LVIDd:         6.60 cm  Diastology LVIDs:         6.20 cm  LV e' lateral:   4.46 cm/s LV PW:  1.00 cm  LV E/e' lateral: 25.4 LV IVS:        0.90 cm  LV e' medial:    4.54 cm/s LVOT diam:     2.30 cm  LV E/e' medial:  24.9 LV SV:         38 LV SV Index:   19 LVOT Area:     4.15 cm  RIGHT VENTRICLE             IVC RV Basal diam:  3.30 cm     IVC diam: 2.30 cm RV Mid diam:    3.00 cm RV S prime:     12.30 cm/s TAPSE (M-mode): 1.9 cm LEFT ATRIUM              Index       RIGHT ATRIUM           Index LA diam:        5.20 cm  2.64 cm/m  RA Area:     24.30 cm LA Vol (A2C):   159.0 ml 80.58 ml/m RA Volume:   85.10 ml  43.13 ml/m LA Vol (A4C):   101.0 ml 51.19 ml/m LA Biplane Vol: 128.0 ml 64.87 ml/m  AORTIC VALVE LVOT Vmax:   67.80 cm/s LVOT Vmean:  44.300 cm/s LVOT VTI:    0.091 m AI PHT:      441 msec  AORTA Ao Root diam: 4.30 cm Ao Asc diam:  3.80 cm MITRAL VALVE                 TRICUSPID VALVE MV Area (PHT): 2.69 cm      TR Peak grad:   36.7 mmHg MV Decel Time: 282 msec      TR Vmax:        303.00 cm/s MR Peak grad:    71.2 mmHg MR Mean grad:    46.0 mmHg   SHUNTS MR Vmax:         422.00 cm/s Systemic VTI:  0.09 m MR Vmean:        322.0 cm/s  Systemic Diam: 2.30 cm MR PISA:         1.57 cm MR PISA Eff ROA: 14 mm MR PISA Radius:  0.50 cm MV E velocity: 113.25 cm/s Kirk Ruths  MD Electronically signed by Kirk Ruths MD Signature Date/Time: 01/24/2020/2:44:34 PM    Final     Patient Profile     69 y.o. male with acute on chronic  systolic and diastolic congestive heart failure and lung mass consistent with lung cancer.  Assessment & Plan    1 acute on chronic systolic congestive heart failure-I/O-850; wt 73 kg.  Patient appears to be euvolemic on examination.  Continue Lasix at present dose.  Needs fluid restriction and low-sodium diet.  2 nonischemic cardiomyopathy-apparently had cardiac catheterization previously that did not show significant obstructive coronary disease.  Plan medical therapy.  We will continue low-dose carvedilol.  Would consider addition of ARB or Entresto as an outpatient if blood pressure allows.  Titrate medications as tolerated.  3 moderate to severe mitral regurgitation-patient will need follow-up echoes in the future.  4 lung mass-patient is felt to probably have lung cancer.  Further evaluation per primary care and pulmonary.  [facility] will sign off.   Medication Recommendations: Continue present medications at discharge.  We will consider addition of low-dose spironolactone and ARB or Entresto as an outpatient if blood pressure allows. Other recommendations (labs, testing, etc): Check potassium and  renal function 1 week following discharge. Follow up as an outpatient: Follow-up Dr. Acie Fredrickson 2 to 4 weeks following discharge.  For questions or updates, please contact [place] Please consult [email or web address] for contact info under        Signed, Kirk Ruths, MD  01/26/2020, 7:25 AM

## 2020-02-21 ENCOUNTER — Telehealth: Payer: Self-pay | Admitting: Family Medicine

## 2020-02-21 ENCOUNTER — Other Ambulatory Visit: Payer: Self-pay

## 2020-02-21 ENCOUNTER — Ambulatory Visit (INDEPENDENT_AMBULATORY_CARE_PROVIDER_SITE_OTHER): Payer: Medicare HMO | Admitting: Family Medicine

## 2020-02-21 ENCOUNTER — Encounter: Payer: Self-pay | Admitting: Family Medicine

## 2020-02-21 VITALS — BP 102/64 | HR 79 | Temp 97.9°F | Ht 70.0 in | Wt 170.8 lb

## 2020-02-21 DIAGNOSIS — I509 Heart failure, unspecified: Secondary | ICD-10-CM | POA: Diagnosis not present

## 2020-02-21 DIAGNOSIS — Z23 Encounter for immunization: Secondary | ICD-10-CM

## 2020-02-21 DIAGNOSIS — J90 Pleural effusion, not elsewhere classified: Secondary | ICD-10-CM

## 2020-02-21 DIAGNOSIS — R7309 Other abnormal glucose: Secondary | ICD-10-CM | POA: Insufficient documentation

## 2020-02-21 DIAGNOSIS — R918 Other nonspecific abnormal finding of lung field: Secondary | ICD-10-CM | POA: Diagnosis not present

## 2020-02-21 DIAGNOSIS — I2510 Atherosclerotic heart disease of native coronary artery without angina pectoris: Secondary | ICD-10-CM | POA: Diagnosis not present

## 2020-02-21 LAB — URINALYSIS, ROUTINE W REFLEX MICROSCOPIC
Bilirubin Urine: NEGATIVE
Hgb urine dipstick: NEGATIVE
Ketones, ur: NEGATIVE
Leukocytes,Ua: NEGATIVE
Nitrite: NEGATIVE
RBC / HPF: NONE SEEN (ref 0–?)
Specific Gravity, Urine: 1.015 (ref 1.000–1.030)
Total Protein, Urine: NEGATIVE
Urine Glucose: NEGATIVE
Urobilinogen, UA: 0.2 (ref 0.0–1.0)
WBC, UA: NONE SEEN (ref 0–?)
pH: 6.5 (ref 5.0–8.0)
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
## 2020-02-21 NOTE — Telephone Encounter (Signed)
Patient is returning the call. CB is [phone]

## 2020-02-22 ENCOUNTER — Encounter: Payer: Self-pay | Admitting: *Deleted

## 2020-02-22 LAB — COMPREHENSIVE METABOLIC PANEL
ALT: 14 U/L (ref 0–53)
AST: 20 U/L (ref 0–37)
Albumin: 4 g/dL (ref 3.5–5.2)
Alkaline Phosphatase: 99 U/L (ref 39–117)
BUN: 19 mg/dL (ref 6–23)
CO2: 31 mEq/L (ref 19–32)
Calcium: 9 mg/dL (ref 8.4–10.5)
Chloride: 101 mEq/L (ref 96–112)
Creatinine, Ser: 0.94 mg/dL (ref 0.40–1.50)
GFR: 79.26 mL/min (ref >60.00)
Glucose, Bld: 107 mg/dL — ABNORMAL HIGH (ref 70–99)
Potassium: 4.3 mEq/L (ref 3.5–5.1)
Sodium: 141 mEq/L (ref 135–145)
Total Bilirubin: 0.5 mg/dL (ref 0.2–1.2)
Total Protein: 7 g/dL (ref 6.0–8.3)

## 2020-02-22 LAB — LIPID PANEL
Cholesterol: 181 mg/dL (ref 0–200)
HDL: 46.7 mg/dL (ref >39.00)
LDL Cholesterol: 101 mg/dL — ABNORMAL HIGH (ref 0–99)
NonHDL: 134.52
Total CHOL/HDL Ratio: 4
Triglycerides: 166 mg/dL — ABNORMAL HIGH (ref 0.0–149.0)
VLDL: 33.2 mg/dL (ref 0.0–40.0)

## 2020-02-22 LAB — CBC
HCT: 47 % (ref 39.0–52.0)
Hemoglobin: 15.5 g/dL (ref 13.0–17.0)
MCHC: 33 g/dL (ref 30.0–36.0)
MCV: 87.9 fl (ref 78.0–100.0)
Platelets: 212 10*3/uL (ref 150.0–400.0)
RBC: 5.35 Mil/uL (ref 4.22–5.81)
RDW: 14.1 % (ref 11.5–15.5)
WBC: 5.9 10*3/uL (ref 4.0–10.5)

## 2020-02-22 LAB — SEDIMENTATION RATE: Sed Rate: 81 mm/hr — ABNORMAL HIGH (ref 0–20)

## 2020-02-22 LAB — LDL CHOLESTEROL, DIRECT: Direct LDL: 116 mg/dL

## 2020-02-22 LAB — HEMOGLOBIN A1C: Hgb A1c MFr Bld: 6.1 % (ref 4.6–6.5)

## 2020-02-22 LAB — PSA: PSA: 1.44 ng/mL (ref 0.10–4.00)

## 2020-02-22 NOTE — Progress Notes (Signed)
Reached out to [place] to introduce myself as the office RN Navigator and explain our new patient process. Reviewed the reason for their referral and scheduled their new patient appointment along with labs. Provided address and directions to the office including call back phone number. Reviewed with patient any concerns they may have or any possible barriers to attending their appointment.   Informed patient about my role as a navigator and that I will meet with them prior to their New Patient appointment and more fully discuss what services I can provide. At this time patient has no further questions or needs.

## 2020-02-25 MED ORDER — ATORVASTATIN CALCIUM 20 MG PO TABS: 20.0000 mg | ORAL_TABLET | Freq: Every day | ORAL | 3 refills | Status: DC

## 2020-02-25 NOTE — Addendum Note (Signed)
Addended by: Jon Billings on: 02/25/2020 09:10 AM   Modules accepted: Orders

## 2020-02-27 ENCOUNTER — Other Ambulatory Visit: Payer: Self-pay

## 2020-02-27 ENCOUNTER — Encounter: Payer: Self-pay | Admitting: *Deleted

## 2020-02-27 ENCOUNTER — Inpatient Hospital Stay: Payer: Medicare Other

## 2020-02-27 ENCOUNTER — Inpatient Hospital Stay: Payer: Medicare Other | Attending: Hematology & Oncology | Admitting: Hematology & Oncology

## 2020-02-27 ENCOUNTER — Encounter: Payer: Self-pay | Admitting: Hematology & Oncology

## 2020-02-27 VITALS — BP 111/69 | HR 73 | Temp 97.7°F | Resp 20 | Ht 70.0 in | Wt 169.8 lb

## 2020-02-27 DIAGNOSIS — I340 Nonrheumatic mitral (valve) insufficiency: Secondary | ICD-10-CM | POA: Diagnosis not present

## 2020-02-27 DIAGNOSIS — R918 Other nonspecific abnormal finding of lung field: Secondary | ICD-10-CM | POA: Diagnosis not present

## 2020-02-27 DIAGNOSIS — Z7722 Contact with and (suspected) exposure to environmental tobacco smoke (acute) (chronic): Secondary | ICD-10-CM | POA: Insufficient documentation

## 2020-02-27 LAB — CMP (CANCER CENTER ONLY)
ALT: 18 U/L (ref 0–44)
AST: 22 U/L (ref 15–41)
Albumin: 4.2 g/dL (ref 3.5–5.0)
Alkaline Phosphatase: 85 U/L (ref 38–126)
Anion gap: 9 (ref 5–15)
BUN: 28 mg/dL — ABNORMAL HIGH (ref 8–23)
CO2: 32 mmol/L (ref 22–32)
Calcium: 9.9 mg/dL (ref 8.9–10.3)
Chloride: 101 mmol/L (ref 98–111)
Creatinine: 1.09 mg/dL (ref 0.61–1.24)
GFR, Est AFR Am: >60 mL/min (ref >60)
GFR, Estimated: >60 mL/min (ref >60)
Glucose, Bld: 105 mg/dL — ABNORMAL HIGH (ref 70–99)
Potassium: 3.9 mmol/L (ref 3.5–5.1)
Sodium: 142 mmol/L (ref 135–145)
Total Bilirubin: 0.8 mg/dL (ref 0.3–1.2)
Total Protein: 7.3 g/dL (ref 6.5–8.1)

## 2020-02-27 LAB — CBC WITH DIFFERENTIAL (CANCER CENTER ONLY)
Abs Immature Granulocytes: 0 10*3/uL (ref 0.00–0.07)
Basophils Absolute: 0 10*3/uL (ref 0.0–0.1)
Basophils Relative: 1 %
Eosinophils Absolute: 0.2 10*3/uL (ref 0.0–0.5)
Eosinophils Relative: 4 %
HCT: 47.7 % (ref 39.0–52.0)
Hemoglobin: 15.3 g/dL (ref 13.0–17.0)
Immature Granulocytes: 0 %
Lymphocytes Relative: 31 %
Lymphs Abs: 1.7 10*3/uL (ref 0.7–4.0)
MCH: 28 pg (ref 26.0–34.0)
MCHC: 32.1 g/dL (ref 30.0–36.0)
MCV: 87.2 fL (ref 80.0–100.0)
Monocytes Absolute: 0.6 10*3/uL (ref 0.1–1.0)
Monocytes Relative: 11 %
Neutro Abs: 3 10*3/uL (ref 1.7–7.7)
Neutrophils Relative %: 53 %
Platelet Count: 268 10*3/uL (ref 150–400)
RBC: 5.47 MIL/uL (ref 4.22–5.81)
RDW: 12.7 % (ref 11.5–15.5)
WBC Count: 5.6 10*3/uL (ref 4.0–10.5)
nRBC: 0 % (ref 0.0–0.2)

## 2020-02-27 LAB — LACTATE DEHYDROGENASE: LDH: 209 U/L — ABNORMAL HIGH (ref 98–192)

## 2020-02-27 NOTE — Progress Notes (Signed)
Initial RN Navigator Patient Visit  Name: Philip Campbell Date of Referral : 02/22/20 Diagnosis: Lung Mass  Met with patient prior to their visit with MD. Gave patient "Your Patient Navigator" handout which explains my role, areas in which I am able to help, and all the contact information for myself and the office. Also gave patient MD and Navigator business card. Reviewed with patient the general overview of expected course after initial diagnosis and time frame for all steps to be completed.  New patient packet given to patient which includes: orientation to office and staff; campus directory; education on My Chart and Advance Directives; and patient centered education on Lung Cancer.   Patient completed visit with Dr. Ennever  Revisited with patient after MD visit. Patient will need  PET Scan - PA submitted to insurance. Will schedule once approval is received.   Patient understands all follow up procedures and expectations. They have my number to reach out for any further clarification or additional needs.    

## 2020-02-27 NOTE — Progress Notes (Signed)
Referral MD  Reason for Referral: Right upper lung mass-none smoker  Chief Complaint  Patient presents with  . New Patient (Initial Visit)    "I have some spots on my lung"  : I was told I had a dark spot on my CT scan.  HPI: [name] is an incredibly interesting 69 year old white male.  He is originally from [place] out by [facility].  He has been in [place] for about a year.  What is even more interesting about 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  He had a company that moved [employer] all over [place].  It was incredibly interesting talking to him about how this was done.  We also think about Mr. Klem, the 1 thing I think about is the classic 1982 movie [employer].  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  We are seeing Mr. Blatchley because he had a CT scan that was done.  This was done because he was having some the chest discomfort.  What is important to know is that he apparently has incredibly bad heart function.  I am shocked that his echocardiogram shows an ejection fraction of less than 20%.  He has severe mitral regurgitation.  I think he sees a cardiologist in a couple weeks.  He did have a CT scan of the chest done.  This was done in early April.  This showed a 3.8 x 2 x 1.8 cm mass in the right upper lung.  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  Again, he has never smoked.  I would doubt that he would be a surgical candidate given the poor cardiac function.  He does have history of heavy tobacco exposure.  He has secondhand smoke exposure.  He has exposure from occupation.  He has not lost weight.  He has had no rashes.  Is been no bony pain.  He has had no change in bowel or bladder habits.  He drinks maybe once a month.  He has had no headache.  There is no dysphagia or odynophagia.  Currently, I would say his performance status is ECOG 0.   Past Medical  History:  Diagnosis Date  . Chronic systolic CHF (congestive heart failure) ([place])   :  Past Surgical History:  Procedure Laterality Date  . ELBOW BURSA SURGERY    . GALLBLADDER SURGERY    . HERNIA REPAIR    :   Current Outpatient Medications:  .  atorvastatin (LIPITOR) 20 MG tablet, Take 1 tablet (20 mg total) by mouth daily., Disp: 90 tablet, Rfl: 3 .  furosemide (LASIX) 20 MG tablet, Take 20 mg by mouth at bedtime., Disp: , Rfl:  .  furosemide (LASIX) 40 MG tablet, Take 60 mg by mouth daily., Disp: , Rfl:  .  carvedilol (COREG) 3.125 MG tablet, Take 1 tablet (3.125 mg total) by mouth 2 (two) times daily with a meal., Disp: 60 tablet, Rfl: 0 .  carvedilol (COREG) 3.125 MG tablet, Take 3.125 mg by mouth 2 (two) times daily with a meal., Disp: , Rfl:  .  pantoprazole (PROTONIX) 40 MG tablet, Take 40 mg by mouth daily., Disp: , Rfl:  .  potassium chloride SA (KLOR-CON) 20 MEQ tablet, Take 1 tablet (20 mEq total) by mouth daily., Disp: 30 tablet, Rfl: 0 .  potassium chloride SA (KLOR-CON) 20 MEQ tablet, Take 20 mEq by mouth  daily., Disp: , Rfl: :  :  No Known Allergies:  Family History  Problem Relation Age of Onset  . CAD Mother        CABG x3 in her 85's  . Alcoholism Mother   . Heart failure Neg Hx   :  Social History   Socioeconomic History  . Marital status: Single    Spouse name: Not on file  . Number of children: Not on file  . Years of education: Not on file  . Highest education level: Not on file  Occupational History  . Not on file  Tobacco Use  . Smoking status: Never Smoker  . Smokeless tobacco: Never Used  Substance and Sexual Activity  . Alcohol use: Never  . Drug use: Never  . Sexual activity: Not on file  Other Topics Concern  . Not on file  Social History Narrative  . Not on file   Social Determinants of Health   Financial Resource Strain:   . Difficulty of Paying Living Expenses:   Food Insecurity:   . Worried About Charity fundraiser in  the Last Year:   . Arboriculturist in the Last Year:   Transportation Needs:   . Film/video editor (Medical):   Marland Kitchen Lack of Transportation (Non-Medical):   Physical Activity:   . Days of Exercise per Week:   . Minutes of Exercise per Session:   Stress:   . Feeling of Stress :   Social Connections:   . Frequency of Communication with Friends and Family:   . Frequency of Social Gatherings with Friends and Family:   . Attends Religious Services:   . Active Member of Clubs or Organizations:   . Attends Archivist Meetings:   Marland Kitchen Marital Status:   Intimate Partner Violence:   . Fear of Current or Ex-Partner:   . Emotionally Abused:   Marland Kitchen Physically Abused:   . Sexually Abused:   :  Review of Systems  Constitutional: Negative.   HENT: Negative.   Eyes: Negative.   Respiratory: Negative.   Cardiovascular: Negative.   Gastrointestinal: Negative.   Genitourinary: Negative.   Musculoskeletal: Negative.   Skin: Negative.   Neurological: Negative.   Endo/Heme/Allergies: Negative.   Psychiatric/Behavioral: Negative.      Exam: This is a fairly well-developed and well-nourished white male in no obvious distress.  Vital signs are temperature of 97 7.  Pulse 73.  Blood pressure 111/69.  Weight is 170 pounds.  Head and neck exam shows no ocular or oral lesions.  He has no palpable cervical or supraclavicular lymph nodes.  There is no jugular vein distention.  Lungs are clear bilaterally.  He has no wheezes or rhonchi.  Cardiac exam regular rate and rhythm.  He has a 2/6 systolic ejection murmur.  There is no friction rub.  Abdomen is soft.  He has good bowel sounds.  There is no fluid wave.  There is no palpable liver or spleen tip.  Back exam shows no tenderness over the spine, ribs or hips.  Extremities shows no clubbing, cyanosis or edema.  He has good range of motion of his joints.  He has good strength in upper and lower extremities.  Skin exam shows no rashes, ecchymoses or  petechia.  Neurological exam shows no focal neurological deficits.  @IPVITALS @   Recent Labs    02/27/20 1027  WBC 5.6  HGB 15.3  HCT 47.7  PLT 268   Recent Labs  02/27/20 1027  NA 142  K 3.9  CL 101  CO2 32  GLUCOSE 105*  BUN 28*  CREATININE 1.09  CALCIUM 9.9    Blood smear review: None  Pathology: None    Assessment and Plan: Mr. Go is a very nice 69 year old white male.  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  Again he has never smoked.  I would think that if this is a bronchogenic carcinoma, this has to be an adenocarcinoma.  I think the problem that we are going to have is that if this is a localized process, he probably would be a surgical candidate if it was not for his cardiac function.  It is going to be his cardiac function that is going to dictate our options.  We will get a PET scan on him.  I think this is imperative.  We will then plan on a biopsy depending on what the PET scan shows.  Again I had to believe that this is going to be an adeno carcinoma.  If we do find that he has a primary bronchogenic carcinoma, then I would consider stereotactic radiosurgery if this is localized.  If it is not localized, then we will have to consider systemic therapy.  I would then proceed with next generation sequencing to see if there are any molecular markers that we might be able to target.  I spent about an hour with Mr. Vitolo.  It was absolutely fascinating talking to him about his job.  Amazingly, he makes the job is sound pretty simple.  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  I answered his questions.  I showed him the CAT scan and the problems.  We went over his echocardiogram.  He will be interesting to see what the cardiologist can do for him.  I am just amazed that he has such poor cardiac functioning and yet he looks as healthy as he does.  We will try to would do a lot  of this follow-up over the phone.  We will clearly get him back when we come up with a treatment plan for him.

## 2020-02-28 ENCOUNTER — Telehealth: Payer: Self-pay | Admitting: Hematology & Oncology

## 2020-02-28 ENCOUNTER — Encounter: Payer: Self-pay | Admitting: *Deleted

## 2020-02-28 NOTE — Progress Notes (Signed)
Patient scheduled for PET scan on 03/11/2020  Called patient and notified him of time, date and location of PET. Also reviewed all PET prep instruction. In addition, mailed a cosy of appointment details to his home.

## 2020-02-28 NOTE — Telephone Encounter (Signed)
NO LOS 5/5

## 2020-02-29 NOTE — Telephone Encounter (Signed)
Spoke with patient who verbally understood lab results and recommendations. Patient states that he will follow up with oncology and also pick up Rx that was sent to pharmacy.

## 2020-03-03 ENCOUNTER — Other Ambulatory Visit: Payer: Self-pay | Admitting: Family Medicine

## 2020-03-03 NOTE — Telephone Encounter (Signed)
Last OV 02/21/20 Last fill for Carvedilol 01/26/20  #60/0 Last fill for Klor-Con  01/26/20  #30/0 I did not see 40mg  for the Furosemide

## 2020-03-04 NOTE — Telephone Encounter (Signed)
MA-Plz see refill requests/thx dmf

## 2020-03-04 NOTE — Telephone Encounter (Signed)
If he's not completely out, he may want to wait for his appointment with cardiology in 6 days.

## 2020-03-05 ENCOUNTER — Other Ambulatory Visit: Payer: Self-pay

## 2020-03-05 ENCOUNTER — Telehealth: Payer: Self-pay | Admitting: Family Medicine

## 2020-03-05 DIAGNOSIS — I5022 Chronic systolic (congestive) heart failure: Secondary | ICD-10-CM

## 2020-03-05 MED ORDER — POTASSIUM CHLORIDE CRYS ER 20 MEQ PO TBCR: 20.0000 meq | EXTENDED_RELEASE_TABLET | Freq: Every day | ORAL | 0 refills | Status: DC

## 2020-03-05 MED ORDER — CARVEDILOL 3.125 MG PO TABS: 3.1250 mg | ORAL_TABLET | Freq: Two times a day (BID) | ORAL | 0 refills | Status: DC

## 2020-03-05 NOTE — Telephone Encounter (Signed)
Pharmacist called to say he has been attempting to get these medications refilled and the patient has been out for a week now. KlorCon 20, Furosemide 40 and Carvedilol 3.125.

## 2020-03-06 MED ORDER — FUROSEMIDE 40 MG PO TABS: 60.0000 mg | ORAL_TABLET | Freq: Every day | ORAL | 1 refills | Status: DC

## 2020-03-06 MED ORDER — FUROSEMIDE 20 MG PO TABS: 20.0000 mg | ORAL_TABLET | Freq: Every day | ORAL | 1 refills | Status: DC

## 2020-03-06 MED ORDER — CARVEDILOL 3.125 MG PO TABS: 3.1250 mg | ORAL_TABLET | Freq: Two times a day (BID) | ORAL | 1 refills | Status: DC

## 2020-03-06 MED ORDER — POTASSIUM CHLORIDE CRYS ER 20 MEQ PO TBCR: 20.0000 meq | EXTENDED_RELEASE_TABLET | Freq: Every day | ORAL | 1 refills | Status: DC

## 2020-03-10 ENCOUNTER — Ambulatory Visit (INDEPENDENT_AMBULATORY_CARE_PROVIDER_SITE_OTHER): Payer: Medicare Other | Admitting: Cardiology

## 2020-03-10 ENCOUNTER — Encounter: Payer: Self-pay | Admitting: Cardiology

## 2020-03-10 ENCOUNTER — Other Ambulatory Visit: Payer: Self-pay

## 2020-03-10 VITALS — BP 122/80 | HR 75 | Temp 97.1°F | Ht 70.0 in | Wt 170.8 lb

## 2020-03-10 DIAGNOSIS — I5042 Chronic combined systolic (congestive) and diastolic (congestive) heart failure: Secondary | ICD-10-CM | POA: Diagnosis not present

## 2020-03-10 DIAGNOSIS — I5022 Chronic systolic (congestive) heart failure: Secondary | ICD-10-CM | POA: Diagnosis not present

## 2020-03-10 DIAGNOSIS — I340 Nonrheumatic mitral (valve) insufficiency: Secondary | ICD-10-CM | POA: Diagnosis not present

## 2020-03-10 DIAGNOSIS — I428 Other cardiomyopathies: Secondary | ICD-10-CM | POA: Diagnosis not present

## 2020-03-10 DIAGNOSIS — I77810 Thoracic aortic ectasia: Secondary | ICD-10-CM

## 2020-03-10 MED ORDER — LOSARTAN POTASSIUM 25 MG PO TABS
25.0000 mg | ORAL_TABLET | Freq: Every day | ORAL | 3 refills | Status: DC
Start: 2020-03-10 — End: 2020-03-25

## 2020-03-10 NOTE — Patient Instructions (Signed)
Medication Instructions:  START losartan 25 mg daily  *If you need a refill on your cardiac medications before your next appointment, please call your pharmacy*   Lab Work: BMET, Mag today  If you have labs (blood work) drawn today and your tests are completely normal, you will receive your results only by: Marland Kitchen MyChart Message (if you have MyChart) OR . A paper copy in the mail If you have any lab test that is abnormal or we need to change your treatment, we will call you to review the results.   Testing/Procedures: Your physician has requested that you have a cardiac MRI. Cardiac MRI uses a computer to create images of your heart as its beating, producing both still and moving pictures of your heart and major blood vessels. For further information please visit [email or web address] Please follow the instruction sheet given to you today for more information.  Follow-Up: At [facility], you and your health needs are our priority.  As part of our continuing mission to provide you with exceptional heart care, we have created designated Provider Care Teams.  These Care Teams include your primary Cardiologist (physician) and Advanced Practice Providers (APPs -  Physician Assistants and Nurse Practitioners) who all work together to provide you with the care you need, when you need it.  We recommend signing up for the patient portal called "MyChart".  Sign up information is provided on this After Visit Summary.  MyChart is used to connect with patients for Virtual Visits (Telemedicine).  Patients are able to view lab/test results, encounter notes, upcoming appointments, etc.  Non-urgent messages can be sent to your provider as well.   To learn more about what you can do with MyChart, go to [email or web address].    Your next appointment:   1-2 weeks with pharmacist (medication titration)  1 month with Dr. Gardiner Rhyme

## 2020-03-10 NOTE — Progress Notes (Signed)
Cardiology Office Note:    Date:  03/11/2020   ID:  Philip Campbell, DOB November 30, 1950, MRN [id number]  PCP:  Libby Maw, MD  Cardiologist:  No primary care provider on file.  Electrophysiologist:  None   Referring MD: Libby Maw,*   Chief Complaint  Patient presents with  . Congestive Heart Failure    History of Present Illness:    Philip Campbell is a 69 y.o. male with a hx of chronic systolic heart failure (EF 20%), lung mass who is referred by Dr. Ethelene Hal for evaluation of heart failure.  He was admitted to [facility] in [place] in September 2020 with shortness of breath.  Found to have EF 20%.  Reportedly had cardiac cath at that time that was unremarkable.  Also found to have severe mitral regurgitation.  He was diuresed and started on Lasix, ACE inhibitor, and beta-blocker.  He had to stop the ACE inhibitor and beta-blocker due to low BPs.  He presented to [facility] ED on 01/23/2020 with worsening shortness of breath.  He was diuresed and started on Coreg.  ACE/ARB/Arni was not started due to low BP.  Also found to have lung mass, was referred to Dr. Marin Olp, PET scan planned.  No smoking history.  Family history includes mother had CABG in 32s.  He reports that since his discharge from [facility], he has been doing well.  He is taking Lasix 40 mg / 20 mg every morning/nightly, and reports that his weight has been stable.  He is weighing himself daily.  Reports BP has been stable in the 100s to 120s over 70s to 80s.  He denies any chest pain, dyspnea, lightheadedness, syncope, palpitations, or lower extremity edema.   Past Medical History:  Diagnosis Date  . Chronic systolic CHF (congestive heart failure) ([facility])     Past Surgical History:  Procedure Laterality Date  . ELBOW BURSA SURGERY    . GALLBLADDER SURGERY    . HERNIA REPAIR      Current Medications: Current Meds  Medication Sig  . atorvastatin (LIPITOR) 20 MG tablet Take 1 tablet (20 mg  total) by mouth daily.  . carvedilol (COREG) 3.125 MG tablet Take 1 tablet (3.125 mg total) by mouth 2 (two) times daily with a meal.  . furosemide (LASIX) 20 MG tablet Take 1 tablet (20 mg total) by mouth at bedtime.  . furosemide (LASIX) 40 MG tablet Take 40 mg by mouth every morning.  . pantoprazole (PROTONIX) 40 MG tablet Take 40 mg by mouth daily.  . potassium chloride SA (KLOR-CON) 20 MEQ tablet Take 1 tablet (20 mEq total) by mouth daily.  . [DISCONTINUED] furosemide (LASIX) 40 MG tablet Take 1.5 tablets (60 mg total) by mouth daily. (Patient taking differently: Take 40 mg by mouth daily. )     Allergies:   Patient has no known allergies.   Social History   Socioeconomic History  . Marital status: Single    Spouse name: Not on file  . Number of children: Not on file  . Years of education: Not on file  . Highest education level: Not on file  Occupational History  . Not on file  Tobacco Use  . Smoking status: Never Smoker  . Smokeless tobacco: Never Used  Substance and Sexual Activity  . Alcohol use: Never  . Drug use: Never  . Sexual activity: Not on file  Other Topics Concern  . Not on file  Social History Narrative  . Not on  file   Social Determinants of Health   Financial Resource Strain:   . Difficulty of Paying Living Expenses:   Food Insecurity:   . Worried About Charity fundraiser in the Last Year:   . Arboriculturist in the Last Year:   Transportation Needs:   . Film/video editor (Medical):   Marland Kitchen Lack of Transportation (Non-Medical):   Physical Activity:   . Days of Exercise per Week:   . Minutes of Exercise per Session:   Stress:   . Feeling of Stress :   Social Connections:   . Frequency of Communication with Friends and Family:   . Frequency of Social Gatherings with Friends and Family:   . Attends Religious Services:   . Active Member of Clubs or Organizations:   . Attends Archivist Meetings:   Marland Kitchen Marital Status:      Family  History: The patient'sfamily history includes Alcoholism in his mother; CAD in his mother. There is no history of Heart failure.  ROS:   Please see the history of present illness.    All other systems reviewed and are negative.  EKGs/Labs/Other Studies Reviewed:    The following studies were reviewed today:   EKG:  EKG is ordered today.  The ekg ordered today demonstrates normal sinus rhythm, rate 75, LVH with repolarization abnormalities  TTE 01/24/20: 1. Severe global reduction in LV systolic function; severe LVE;  restrictive filling; mildly dilated aortic root (4.3 cm); mild AI:  moderate to severe MR; biatrial enlargement; moderate RV dysfunction; mild  TR.  2. Left ventricular ejection fraction, by estimation, is <20%. The left  ventricle has severely decreased function. The left ventricle demonstrates  global hypokinesis. The left ventricular internal cavity size was severely  dilated. Left ventricular  diastolic parameters are consistent with Grade III diastolic dysfunction  (restrictive). Elevated left atrial pressure.  3. Right ventricular systolic function is moderately reduced. The right  ventricular size is normal. There is mildly elevated pulmonary artery  systolic pressure.  4. Left atrial size was severely dilated.  5. Right atrial size was moderately dilated.  6. The mitral valve is normal in structure. Moderate to severe mitral  valve regurgitation. No evidence of mitral stenosis.  7. The aortic valve is tricuspid. Aortic valve regurgitation is mild.  Mild aortic valve sclerosis is present, with no evidence of aortic valve  stenosis.  8. Aortic dilatation noted. There is mild dilatation of the aortic root  measuring 43 mm.  9. The inferior vena cava is normal in size with greater than 50%   Recent Labs: 01/23/2020: B Natriuretic Peptide 1,845.6 01/24/2020: Magnesium 1.8 02/27/2020: ALT 18; BUN 28; Creatinine 1.09; Hemoglobin 15.3; Platelet Count 268;  Potassium 3.9; Sodium 142  Recent Lipid Panel    Component Value Date/Time   CHOL 181 02/21/2020 1425   TRIG 166.0 (H) 02/21/2020 1425   HDL 46.70 02/21/2020 1425   CHOLHDL 4 02/21/2020 1425   VLDL 33.2 02/21/2020 1425   LDLCALC 101 (H) 02/21/2020 1425   LDLDIRECT 116.0 02/21/2020 1425    Physical Exam:    VS:  BP 122/80   Pulse 75   Temp (!) 97.1 F (36.2 C)   Ht 5\' 10"  (1.778 m)   Wt 170 lb 12.8 oz (77.5 kg)   SpO2 99%   BMI 24.51 kg/m     Wt Readings from Last 3 Encounters:  03/10/20 170 lb 12.8 oz (77.5 kg)  02/27/20 169 lb 12.8 oz (77  kg)  02/21/20 170 lb 12.8 oz (77.5 kg)     GEN: in no acute distress HEENT: Normal NECK: No JVD; No carotid bruits CARDIAC: RRR, 2 out of 6 systolic murmur RESPIRATORY:  Clear to auscultation without rales, wheezing or rhonchi  ABDOMEN: Soft, non-tender, non-distended MUSCULOSKELETAL:  No edema SKIN: Warm and dry NEUROLOGIC:  Alert and oriented x 3 PSYCHIATRIC:  Normal affect   ASSESSMENT:    1. Chronic combined systolic and diastolic heart failure ([place])   2. Mitral valve insufficiency, unspecified etiology   3. NICM (nonischemic cardiomyopathy) ([place])   4. Aortic root dilatation ([facility])    PLAN:    Chronic combined systolic and diastolic heart failure: EF less than 20% on TTE 01/24/2020.  Grade 3 diastolic dysfunction.  Reportedly had normal cath in September 2020.  GDMT has been limited by low blood pressure -Obtain records from prior catheterization -Cardiac MRI to further evaluate etiology of cardiomyopathy -Continue Lasix 40 mg / 20 mg every morning/nightly.  Appears euvolemic.  Will check BMP/magnesium -Continue Coreg 3.125 mg twice daily -BP appears improved, will add losartan 25 mg daily.  -Will monitor BP with starting losartan.  Nexrt steps would be transitioning to [facility] if able and adding low-dose spironolactone.  Will schedule follow-up in pharmacy clinic to continue to titrate heart failure medications.  Mitral  regurgitation: Moderate to severe on TTE 01/24/2020.  Appears functional.  Will quantify degree of regurgitation on CMR as above.  Aortic root dilatation: Measures 43 mm.  Can evaluate aorta on MRI as above  Lung mass: Follows with oncology, PET scan planned  RTC 4 weeks   Medication Adjustments/Labs and Tests Ordered: Current medicines are reviewed at length with the patient today.  Concerns regarding medicines are outlined above.  Orders Placed This Encounter  Procedures  . MR CARDIAC MORPHOLOGY W WO CONTRAST  . Basic metabolic panel  . Magnesium  . EKG 12-Lead   Meds ordered this encounter  Medications  . losartan (COZAAR) 25 MG tablet    Sig: Take 1 tablet (25 mg total) by mouth daily.    Dispense:  30 tablet    Refill:  3    Patient Instructions  Medication Instructions:  START losartan 25 mg daily  *If you need a refill on your cardiac medications before your next appointment, please call your pharmacy*   Lab Work: BMET, Mag today  If you have labs (blood work) drawn today and your tests are completely normal, you will receive your results only by: Marland Kitchen MyChart Message (if you have MyChart) OR . A paper copy in the mail If you have any lab test that is abnormal or we need to change your treatment, we will call you to review the results.   Testing/Procedures: Your physician has requested that you have a cardiac MRI. Cardiac MRI uses a computer to create images of your heart as its beating, producing both still and moving pictures of your heart and major blood vessels. For further information please visit [email or web address] Please follow the instruction sheet given to you today for more information.  Follow-Up: At [facility], you and your health needs are our priority.  As part of our continuing mission to provide you with exceptional heart care, we have created designated Provider Care Teams.  These Care Teams include your primary Cardiologist (physician) and  Advanced Practice Providers (APPs -  Physician Assistants and Nurse Practitioners) who all work together to provide you with the care you need, when you need it.  We recommend signing up for the patient portal called "MyChart".  Sign up information is provided on this After Visit Summary.  MyChart is used to connect with patients for Virtual Visits (Telemedicine).  Patients are able to view lab/test results, encounter notes, upcoming appointments, etc.  Non-urgent messages can be sent to your provider as well.   To learn more about what you can do with MyChart, go to [email or web address].    Your next appointment:   1-2 weeks with pharmacist (medication titration)  1 month with Dr. Gardiner Rhyme     Signed, Donato Heinz, MD  03/11/2020 12:36 AM    [place]

## 2020-03-11 ENCOUNTER — Other Ambulatory Visit: Payer: Self-pay | Admitting: Hematology & Oncology

## 2020-03-11 ENCOUNTER — Encounter (HOSPITAL_COMMUNITY)
Admission: RE | Admit: 2020-03-11 | Discharge: 2020-03-11 | Disposition: A | Discharge status: Home / Self Care | Payer: Medicare Other | Source: Ambulatory Visit | Attending: Hematology & Oncology | Admitting: Hematology & Oncology

## 2020-03-11 DIAGNOSIS — R918 Other nonspecific abnormal finding of lung field: Secondary | ICD-10-CM | POA: Insufficient documentation

## 2020-03-11 LAB — BASIC METABOLIC PANEL
BUN/Creatinine Ratio: 21 (ref 10–24)
BUN: 21 mg/dL (ref 8–27)
CO2: 26 mmol/L (ref 20–29)
Calcium: 9.7 mg/dL (ref 8.6–10.2)
Chloride: 96 mmol/L (ref 96–106)
Creatinine, Ser: 1.02 mg/dL (ref 0.76–1.27)
GFR calc Af Amer: 86 mL/min/{1.73_m2} (ref >59)
GFR calc non Af Amer: 74 mL/min/{1.73_m2} (ref >59)
Glucose: 98 mg/dL (ref 65–99)
Potassium: 4.5 mmol/L (ref 3.5–5.2)
Sodium: 140 mmol/L (ref 134–144)

## 2020-03-11 LAB — MAGNESIUM: Magnesium: 1.9 mg/dL (ref 1.6–2.3)

## 2020-03-12 ENCOUNTER — Telehealth: Payer: Self-pay | Admitting: *Deleted

## 2020-03-12 ENCOUNTER — Other Ambulatory Visit: Payer: Self-pay | Admitting: *Deleted

## 2020-03-12 DIAGNOSIS — Z79899 Other long term (current) drug therapy: Secondary | ICD-10-CM

## 2020-03-12 DIAGNOSIS — I5042 Chronic combined systolic (congestive) and diastolic (congestive) heart failure: Secondary | ICD-10-CM

## 2020-03-12 NOTE — Telephone Encounter (Signed)
Spoke with patient regarding the Cardiac MRI ordered by Dr. Idamae Lusher would like for me to call back in July--he states he is not financially able to schedule at this time

## 2020-03-13 ENCOUNTER — Encounter (HOSPITAL_COMMUNITY): Payer: Self-pay

## 2020-03-13 ENCOUNTER — Encounter: Payer: Self-pay | Admitting: *Deleted

## 2020-03-13 NOTE — Progress Notes (Unsigned)
Tommie Ard Male, 69 y.o., 1951/06/01 MRN:  [id number] Phone:  [phone] Jerilynn Mages) PCP:  Libby Maw, MD Coverage:  [facility] ([facility] 3) 03/19/2020 at 11:00 AM  RE: Biopsy Received: Yesterday Message Contents  Corrie Mckusick, DO  Lennox [facility]ers E      OK for CT guided biopsy RUL mass.   Earleen Newport   Previous Messages  ----- Message -----  From: Lenore Cordia  Sent: 03/12/2020  2:00 PM EDT  To: Ir Procedure Requests  Subject: Biopsy                      Procedure Requested: CT Biopsy    Reason for Procedure: RUL nodule. patient refuses PET scan. need tissue to prove malignancy    Provider Requesting: Volanda Napoleon  Provider Telephone: [phone]    Other Info:

## 2020-03-13 NOTE — Progress Notes (Signed)
Patient refused PET scan due to out of pocket cost. Patient is insured and copay was greater than patient was comfortable paying, even with payment plan. Discussed with Dr Marin Olp who will request biopsy using the patient's previous CT scan.   Called patient to inform him of decision to move onto biopsy and for him to expect a phone call to schedule biopsy. He was thankful for the information.   At time of publishing this note, biopsy has been scheduled for 03/19/2020

## 2020-03-17 ENCOUNTER — Telehealth: Payer: Self-pay | Admitting: Cardiology

## 2020-03-17 ENCOUNTER — Ambulatory Visit (HOSPITAL_COMMUNITY): Admission: RE | Admit: 2020-03-17 | Payer: Medicare Other | Source: Ambulatory Visit

## 2020-03-17 NOTE — Telephone Encounter (Signed)
Left message for patient, covid testing was per dr Antonieta Pert office. Ask patient to give their office a call.

## 2020-03-17 NOTE — Telephone Encounter (Signed)
Patient states he is requesting to reschedule his COVID-19 test. He states he went to the wrong location. Please assist.

## 2020-03-18 ENCOUNTER — Other Ambulatory Visit (HOSPITAL_COMMUNITY)
Admission: RE | Admit: 2020-03-18 | Discharge: 2020-03-18 | Disposition: A | Discharge status: Home / Self Care | Payer: Medicare Other | Source: Ambulatory Visit | Attending: Hematology & Oncology | Admitting: Hematology & Oncology

## 2020-03-18 ENCOUNTER — Other Ambulatory Visit: Payer: Self-pay | Admitting: Radiology

## 2020-03-18 DIAGNOSIS — Z20822 Contact with and (suspected) exposure to covid-19: Secondary | ICD-10-CM | POA: Diagnosis not present

## 2020-03-18 DIAGNOSIS — Z01812 Encounter for preprocedural laboratory examination: Secondary | ICD-10-CM | POA: Diagnosis not present

## 2020-03-18 LAB — SARS CORONAVIRUS 2 (TAT 6-24 HRS): SARS Coronavirus 2: NEGATIVE

## 2020-03-19 ENCOUNTER — Telehealth (HOSPITAL_COMMUNITY): Payer: Self-pay | Admitting: *Deleted

## 2020-03-19 ENCOUNTER — Ambulatory Visit (HOSPITAL_COMMUNITY)
Admission: RE | Admit: 2020-03-19 | Discharge: 2020-03-19 | Disposition: A | Discharge status: Home / Self Care | Payer: Medicare Other | Source: Ambulatory Visit | Attending: Interventional Radiology | Admitting: Interventional Radiology

## 2020-03-19 ENCOUNTER — Ambulatory Visit (HOSPITAL_COMMUNITY)
Admission: RE | Admit: 2020-03-19 | Discharge: 2020-03-19 | Disposition: A | Discharge status: Home / Self Care | Payer: Medicare Other | Source: Ambulatory Visit | Attending: Hematology & Oncology | Admitting: Hematology & Oncology

## 2020-03-19 ENCOUNTER — Other Ambulatory Visit: Payer: Self-pay

## 2020-03-19 DIAGNOSIS — Z8249 Family history of ischemic heart disease and other diseases of the circulatory system: Secondary | ICD-10-CM | POA: Insufficient documentation

## 2020-03-19 DIAGNOSIS — Z79899 Other long term (current) drug therapy: Secondary | ICD-10-CM | POA: Insufficient documentation

## 2020-03-19 DIAGNOSIS — I5022 Chronic systolic (congestive) heart failure: Secondary | ICD-10-CM | POA: Diagnosis not present

## 2020-03-19 DIAGNOSIS — J984 Other disorders of lung: Secondary | ICD-10-CM | POA: Diagnosis not present

## 2020-03-19 DIAGNOSIS — J95811 Postprocedural pneumothorax: Secondary | ICD-10-CM

## 2020-03-19 DIAGNOSIS — I340 Nonrheumatic mitral (valve) insufficiency: Secondary | ICD-10-CM | POA: Insufficient documentation

## 2020-03-19 DIAGNOSIS — R918 Other nonspecific abnormal finding of lung field: Secondary | ICD-10-CM | POA: Insufficient documentation

## 2020-03-19 LAB — CBC
HCT: 45 % (ref 39.0–52.0)
Hemoglobin: 14.6 g/dL (ref 13.0–17.0)
MCH: 28.3 pg (ref 26.0–34.0)
MCHC: 32.4 g/dL (ref 30.0–36.0)
MCV: 87.4 fL (ref 80.0–100.0)
Platelets: 176 10*3/uL (ref 150–400)
RBC: 5.15 MIL/uL (ref 4.22–5.81)
RDW: 13 % (ref 11.5–15.5)
WBC: 5.8 10*3/uL (ref 4.0–10.5)
nRBC: 0 % (ref 0.0–0.2)

## 2020-03-19 LAB — PROTIME-INR
INR: 1 (ref 0.8–1.2)
Prothrombin Time: 12.6 seconds (ref 11.4–15.2)

## 2020-03-19 MED ORDER — MIDAZOLAM HCL 2 MG/2ML IJ SOLN
INTRAMUSCULAR | Status: AC | PRN
  Administered 2020-03-19: 1 mg via INTRAVENOUS

## 2020-03-19 MED ORDER — FENTANYL CITRATE (PF) 100 MCG/2ML IJ SOLN
INTRAMUSCULAR | Status: AC | PRN
  Administered 2020-03-19: 25 ug via INTRAVENOUS

## 2020-03-19 MED ORDER — SODIUM CHLORIDE 0.9 % IV SOLN: INTRAVENOUS | Status: DC

## 2020-03-19 MED ORDER — FENTANYL CITRATE (PF) 100 MCG/2ML IJ SOLN
INTRAMUSCULAR | Status: AC
  Filled 2020-03-19: qty 4

## 2020-03-19 MED ORDER — MIDAZOLAM HCL 2 MG/2ML IJ SOLN
INTRAMUSCULAR | Status: AC
  Filled 2020-03-19: qty 4

## 2020-03-19 NOTE — H&P (Signed)
Chief Complaint: Patient was seen in consultation today for a right upper lobe lung mass biopsy.  Referring Physician(s): Ennever,Peter R  Supervising Physician: Aletta Edouard  Patient Status: [facility] - Out-pt  History of Present Illness: Philip Campbell is a 69 y.o. male with a history of CHF (EF 20%), severe mitral regurgitation and recently discovered pulmonary nodules. He presented to the [facility] ED 01/23/20 with complaints of fatigue and shortness of breath. 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   Interventional Radiology has been asked to evaluate this patient for a right upper lobe lung mass biopsy for further work up and diagnosis of this patient.   Past Medical History:  Diagnosis Date  . Chronic systolic CHF (congestive heart failure) (HCC)     Past Surgical History:  Procedure Laterality Date  . ELBOW BURSA SURGERY    . GALLBLADDER SURGERY    . HERNIA REPAIR      Allergies: Patient has no known allergies.  Medications: Prior to Admission medications   Medication Sig Start Date End Date Taking? Authorizing Provider  atorvastatin (LIPITOR) 20 MG tablet Take 1 tablet (20 mg total) by mouth daily. 02/25/20  Yes Libby Maw, MD  carvedilol (COREG) 3.125 MG tablet Take 1 tablet (3.125 mg total) by mouth 2 (two) times daily with a meal. 03/06/20  Yes Libby Maw, MD  furosemide (LASIX) 20 MG tablet Take 1 tablet (20 mg total) by mouth at bedtime. 03/06/20  Yes Libby Maw, MD  furosemide (LASIX) 40 MG tablet Take 40 mg by mouth every morning.   Yes [provider]  losartan (COZAAR) 25 MG tablet Take 1 tablet (25 mg total) by mouth daily. 03/10/20 06/08/20 Yes Donato Heinz, MD  pantoprazole (PROTONIX) 40 MG tablet Take 40 mg by mouth daily. 12/08/19  Yes [provider]  potassium  chloride SA (KLOR-CON) 20 MEQ tablet Take 1 tablet (20 mEq total) by mouth daily. 03/06/20  Yes Libby Maw, MD     Family History  Problem Relation Age of Onset  . CAD Mother        CABG x3 in her 81's  . Alcoholism Mother   . Heart failure Neg Hx     Social History   Socioeconomic History  . Marital status: Single    Spouse name: Not on file  . Number of children: Not on file  . Years of education: Not on file  . Highest education level: Not on file  Occupational History  . Not on file  Tobacco Use  . Smoking status: Never Smoker  . Smokeless tobacco: Never Used  Substance and Sexual Activity  . Alcohol use: Never  . Drug use: Never  . Sexual activity: Not on file  Other Topics Concern  . Not on file  Social History Narrative  . Not on file   Social Determinants of Health   Financial Resource Strain:   . Difficulty of Paying Living Expenses:   Food Insecurity:   . Worried About Charity fundraiser in the Last Year:   . Arboriculturist in the Last Year:   Transportation Needs:   . Film/video editor (Medical):   Marland Kitchen Lack of Transportation (Non-Medical):   Physical Activity:   . Days of Exercise per Week:   . Minutes of Exercise per  Session:   Stress:   . Feeling of Stress :   Social Connections:   . Frequency of Communication with Friends and Family:   . Frequency of Social Gatherings with Friends and Family:   . Attends Religious Services:   . Active Member of Clubs or Organizations:   . Attends Archivist Meetings:   Marland Kitchen Marital Status:     Review of Systems: A 12 point ROS discussed and pertinent positives are indicated in the HPI above.  All other systems are negative.  Review of Systems  Constitutional: Negative for fatigue and fever.  Respiratory: Negative for cough and shortness of breath.   Cardiovascular: Negative for chest pain and leg swelling.  Gastrointestinal: Negative for abdominal distention, abdominal pain, nausea  and vomiting.  Musculoskeletal: Negative for back pain.  Neurological: Negative for syncope and headaches.  Hematological: Does not bruise/bleed easily.    Vital Signs: BP 124/73   Pulse 71   Temp 98.3 F (36.8 C) (Skin)   Resp 17   Ht 5\' 10"  (1.778 m)   Wt 175 lb (79.4 kg)   SpO2 99%   BMI 25.11 kg/m   Physical Exam Constitutional:      General: He is not in acute distress.    Appearance: Normal appearance.  Cardiovascular:     Rate and Rhythm: Normal rate and regular rhythm.     Pulses: Normal pulses.     Heart sounds: Normal heart sounds.  Pulmonary:     Breath sounds: Normal breath sounds.  Abdominal:     General: Bowel sounds are normal. There is no distension.     Palpations: Abdomen is soft.     Tenderness: There is no abdominal tenderness.  Musculoskeletal:        General: Normal range of motion.  Skin:    General: Skin is warm and dry.  Neurological:     Mental Status: He is alert and oriented to person, place, and time.  Psychiatric:        Mood and Affect: Mood normal.        Behavior: Behavior normal.        Thought Content: Thought content normal.        Judgment: Judgment normal.     Imaging: No results found.  Labs:  CBC: Recent Labs    01/23/20 0837 02/21/20 1425 02/27/20 1027  WBC 6.6 5.9 5.6  HGB 13.0 15.5 15.3  HCT 40.6 47.0 47.7  PLT 203 212.0 268    COAGS: No results for input(s): INR, APTT in the last 8760 hours.  BMP: Recent Labs    01/25/20 0449 01/25/20 0449 01/26/20 0458 02/21/20 1425 02/27/20 1027 03/10/20 1537  NA 139   < > 139 141 142 140  K 3.6   < > 3.6 4.3 3.9 4.5  CL 105   < > 105 101 101 96  CO2 24   < > 23 31 32 26  GLUCOSE 102*   < > 99 107* 105* 98  BUN 34*   < > 37* 19 28* 21  CALCIUM 9.0   < > 9.0 9.0 9.9 9.7  CREATININE 1.11   < > 1.07 0.94 1.09 1.02  GFRNONAA >60  --  >60  --  >60 74  GFRAA >60  --  >60  --  >60 86   < > = values in this interval not displayed.    LIVER FUNCTION  TESTS: Recent Labs    02/21/20 1425 02/27/20 1027  BILITOT 0.5 0.8  AST 20 22  ALT 14 18  ALKPHOS 99 85  PROT 7.0 7.3  ALBUMIN 4.0 4.2    TUMOR MARKERS: No results for input(s): AFPTM, CEA, CA199, CHROMGRNA in the last 8760 hours.  Assessment and Plan:  Philip Campbell is a 69 y.o. male with a history of CHF (EF 20%), severe mitral regurgitation and recently discovered pulmonary nodules. He presented to the [facility] ED 01/23/20 with complaints of fatigue and shortness of breath. 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   The patient presents today to the [facility] Interventional Radiology department for a right upper lobe lung mass biopsy.  Imaging has been reviewed and approved by Dr. Earleen Newport.    Risks and benefits of CT guided lung nodule biopsy was discussed with the patient including, but not limited to bleeding, hemoptysis, respiratory failure requiring intubation, infection, pneumothorax requiring chest tube placement, stroke from air embolism or even death.  All of the patient's questions were answered and the patient is agreeable to proceed.  Consent signed and in chart.  Thank you for this interesting consult.  I greatly enjoyed meeting Philip Campbell and look forward to participating in their care.  A copy of this report was sent to the requesting provider on this date.  Electronically Signed: Theresa Duty, NP 03/19/2020, 10:40 AM   I spent a total of  30 Minutes  in face to face in clinical consultation, greater than 50% of which was counseling/coordinating care for Right upper lobe lung mass biopsy.

## 2020-03-19 NOTE — Progress Notes (Signed)
Pt no longer has a ride home. Trying to walk home after lung biopsy. MD and PA notified. Allie, PA to bedside, approved pt to take an [place] or Cab home.

## 2020-03-19 NOTE — Discharge Instructions (Signed)
Lung Biopsy, Care After This sheet gives you information about how to care for yourself after your procedure. Your health care provider may also give you more specific instructions depending on the type of biopsy you had. If you have problems or questions, contact your health care provider. What can I expect after the procedure? After the procedure, it is common to have:  A cough.  A sore throat.  Pain where a needle, bronchoscope, or incision was used to collect a biopsy sample (biopsy site). Follow these instructions at home: Medicines  Take over-the-counter and prescription medicines only as told by your health care provider.  Do not drink alcohol if your health care provider tells you not to drink.  Ask your health care provider if the medicine prescribed to you: ? Requires you to avoid driving or using heavy machinery. ? Can cause constipation. You may need to take these actions to prevent or treat constipation:  Drink enough fluid to keep your urine pale yellow.  Take over-the-counter or prescription medicines.  Eat foods that are high in fiber, such as beans, whole grains, and fresh fruits and vegetables.  Limit foods that are high in fat and processed sugars, such as fried or sweet foods.  Do not drive for 24 hours if you were given a sedative. Biopsy site care   Follow instructions from your health care provider about how to take care of your biopsy site. Make sure you: ? Wash your hands with soap and water before and after you change your bandage (dressing). If soap and water are not available, use Suski sanitizer. ? Remove your dressing as told by your health care provider. In 24 hours  Do not take baths, swim, or use a hot tub until your health care provider approves. Ask your health care provider if you may take showers. You may only be allowed to take sponge baths.  Check your biopsy site every day for signs of infection. Check for: ? Redness, swelling, or more  pain. ? Fluid or blood. ? Warmth. ? Pus or a bad smell. General instructions  Return to your normal activities as told by your health care provider. Ask your health care provider what activities are safe for you.  It is up to you to get the results of your procedure. Ask your health care provider, or the department that is doing the procedure, when your results will be ready.  Keep all follow-up visits as told by your health care provider. This is important. Contact a health care provider if:  You have a fever.  You have redness, swelling, or more pain around your biopsy site.  You have fluid or blood coming from your biopsy site.  Your biopsy site feels warm to the touch.  You have pus or a bad smell coming from your biopsy site.  You have pain that does not get better with medicine. Get help right away if:  You cough up blood.  You have trouble breathing.  You have chest pain.  You lose consciousness. Summary  After the procedure, it is common to have a sore throat and a cough.  Return to your normal activities as told by your health care provider. Ask your health care provider what activities are safe for you.  Take over-the-counter and prescription medicines only as told by your health care provider.  Report any unusual symptoms to your health care provider. This information is not intended to replace advice given to you by your health care provider.  Make sure you discuss any questions you have with your health care provider. Document Revised: 11/15/2018 Document Reviewed: 11/09/2016 Elsevier Patient Education  [place].

## 2020-03-19 NOTE — Procedures (Signed)
Interventional Radiology Procedure Note  Procedure: CT Guided Biopsy of RUL lung mass  Complications: None  Estimated Blood Loss: < 10 mL  Findings: 18 G core biopsy of RUL mass performed under CT guidance.  One core sample obtained and sent to Pathology. Biosentry used to place pleural plug.  Venetia Night. Kathlene Cote, M.D Pager:  [phone]

## 2020-03-20 LAB — SURGICAL PATHOLOGY

## 2020-03-25 ENCOUNTER — Inpatient Hospital Stay: Payer: Medicare Other | Attending: Hematology & Oncology | Admitting: Hematology & Oncology

## 2020-03-25 ENCOUNTER — Other Ambulatory Visit: Payer: Self-pay

## 2020-03-25 ENCOUNTER — Encounter: Payer: Self-pay | Admitting: *Deleted

## 2020-03-25 ENCOUNTER — Ambulatory Visit (INDEPENDENT_AMBULATORY_CARE_PROVIDER_SITE_OTHER): Payer: Medicare Other | Admitting: Pharmacist

## 2020-03-25 VITALS — BP 73/52 | HR 74 | Temp 97.7°F | Resp 18 | Wt 181.0 lb

## 2020-03-25 DIAGNOSIS — R918 Other nonspecific abnormal finding of lung field: Secondary | ICD-10-CM | POA: Diagnosis not present

## 2020-03-25 DIAGNOSIS — I5022 Chronic systolic (congestive) heart failure: Secondary | ICD-10-CM | POA: Diagnosis not present

## 2020-03-25 DIAGNOSIS — R911 Solitary pulmonary nodule: Secondary | ICD-10-CM | POA: Diagnosis not present

## 2020-03-25 MED ORDER — FUROSEMIDE 20 MG PO TABS: ORAL_TABLET | ORAL | 1 refills | Status: DC

## 2020-03-25 MED ORDER — LOSARTAN POTASSIUM 25 MG PO TABS: 12.5000 mg | ORAL_TABLET | Freq: Every day | ORAL | 3 refills | Status: DC

## 2020-03-25 MED ORDER — CARVEDILOL 3.125 MG PO TABS: 3.1250 mg | ORAL_TABLET | Freq: Two times a day (BID) | ORAL | 1 refills | Status: DC

## 2020-03-25 NOTE — Patient Instructions (Addendum)
Return for a  follow up appointment in 3 weeks  Check your blood pressure at home daily (if able) and keep record of the readings.  Take your BP meds as follows: *Decrease furosemide to 20mg  twice daily; okay to take an extra 20mg  in the morning in 3lb or more overnight weight gain* *Decrease losartan to 1/2 tablet daily*  Bring all of your meds, your BP cuff and your record of home blood pressures to your next appointment.  Exercise as you're able, try to walk approximately 30 minutes per day.  Keep salt intake to a minimum, especially watch canned and prepared boxed foods.  Eat more fresh fruits and vegetables and fewer canned items.  Avoid eating in fast food restaurants.    HOW TO TAKE YOUR BLOOD PRESSURE: . Rest 5 minutes before taking your blood pressure. .  Don't smoke or drink caffeinated beverages for at least 30 minutes before. . Take your blood pressure before (not after) you eat. . Sit comfortably with your back supported and both feet on the floor (don't cross your legs). . Elevate your arm to heart level on a table or a desk. . Use the proper sized cuff. It should fit smoothly and snugly around your bare upper arm. There should be enough room to slip a fingertip under the cuff. The bottom edge of the cuff should be 1 inch above the crease of the elbow. . Ideally, take 3 measurements at one sitting and record the average.

## 2020-03-25 NOTE — Progress Notes (Signed)
Hematology and Oncology Follow Up Visit  Philip Campbell [id number] August 17, 1951 69 y.o. 03/25/2020   Principle Diagnosis:   Right upper lung nodule  Current Therapy:    Observation     Interim History:  Philip Campbell is back for follow-up.  We saw him back in early May.  At that time, he presented with a nodule in the right upper lung.  He is a none smoker.  He did have a biopsy done on May 25.  The pathology report ([id number]) showed atypical epithelial proliferation.  This clearly was a sampling issue.  He feels well.  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  I know he is followed by cardiology.  He has had no problems with cough or shortness of breath.  He has had no hemoptysis.  Is been no nausea or vomiting.  He has had no change in bowel or bladder habits.  He did have a nice Memorial Day weekend.  I did speak with Dr. Sondra Come of Radiation Oncology.  He will see Philip Campbell and try to determine if stereotactic radiosurgery might be an option.  Philip Campbell is not able to have a PET scan right now because of financial issues.  Currently, I would have to say his performance status is probably ECOG 1.  Medications:  Current Outpatient Medications:  .  atorvastatin (LIPITOR) 20 MG tablet, Take 1 tablet (20 mg total) by mouth daily., Disp: 90 tablet, Rfl: 3 .  carvedilol (COREG) 3.125 MG tablet, Take 1 tablet (3.125 mg total) by mouth 2 (two) times daily with a meal., Disp: 180 tablet, Rfl: 1 .  furosemide (LASIX) 20 MG tablet, Take 1 tablet (20 mg total) by mouth 2 (two) times daily. May also take 1 tablet (20 mg total) daily as needed for fluid (for 3 or more punds ovenoight weight gain)., Disp: 90 tablet, Rfl: 1 .  losartan (COZAAR) 25 MG tablet, Take 0.5 tablets (12.5 mg total) by mouth daily., Disp: 30 tablet, Rfl: 3 .  pantoprazole (PROTONIX) 40 MG tablet, Take 40 mg by mouth daily., Disp: , Rfl:  .  potassium chloride SA  (KLOR-CON) 20 MEQ tablet, Take 1 tablet (20 mEq total) by mouth daily., Disp: 90 tablet, Rfl: 1  Allergies: No Known Allergies  Past Medical History, Surgical history, Social history, and Family History were reviewed and updated.  Review of Systems: Review of Systems  Constitutional: Negative.   HENT:  Negative.   Eyes: Negative.   Respiratory: Negative.   Cardiovascular: Negative.   Gastrointestinal: Negative.   Endocrine: Negative.   Genitourinary: Negative.    Musculoskeletal: Negative.   Skin: Negative.   Neurological: Negative.   Hematological: Negative.   Psychiatric/Behavioral: Negative.     Physical Exam:  weight is 181 lb (82.1 kg). His temporal temperature is 97.7 F (36.5 C). His blood pressure is 73/52 (abnormal) and his pulse is 74. His respiration is 18 and oxygen saturation is 97%.   Wt Readings from Last 3 Encounters:  03/25/20 181 lb (82.1 kg)  03/25/20 181 lb 3.2 oz (82.2 kg)  03/19/20 175 lb (79.4 kg)    Physical Exam Vitals reviewed.  HENT:     Head: Normocephalic and atraumatic.  Eyes:     Pupils: Pupils are equal, round, and reactive to light.  Cardiovascular:     Rate and Rhythm: Normal rate and regular rhythm.     Heart sounds: Normal heart sounds.  Pulmonary:  Effort: Pulmonary effort is normal.     Breath sounds: Normal breath sounds.  Abdominal:     General: Bowel sounds are normal.     Palpations: Abdomen is soft.  Musculoskeletal:        General: No tenderness or deformity. Normal range of motion.     Cervical back: Normal range of motion.  Lymphadenopathy:     Cervical: No cervical adenopathy.  Skin:    General: Skin is warm and dry.     Findings: No erythema or rash.  Neurological:     Mental Status: He is alert and oriented to person, place, and time.  Psychiatric:        Behavior: Behavior normal.        Thought Content: Thought content normal.        Judgment: Judgment normal.      Lab Results  Component Value  Date   WBC 5.8 03/19/2020   HGB 14.6 03/19/2020   HCT 45.0 03/19/2020   MCV 87.4 03/19/2020   PLT 176 03/19/2020     Chemistry      Component Value Date/Time   NA 140 03/10/2020 1537   K 4.5 03/10/2020 1537   CL 96 03/10/2020 1537   CO2 26 03/10/2020 1537   BUN 21 03/10/2020 1537   CREATININE 1.02 03/10/2020 1537   CREATININE 1.09 02/27/2020 1027      Component Value Date/Time   CALCIUM 9.7 03/10/2020 1537   ALKPHOS 85 02/27/2020 1027   AST 22 02/27/2020 1027   ALT 18 02/27/2020 1027   BILITOT 0.8 02/27/2020 1027      Impression and Plan: Philip Campbell is a 10-year-old white male.  He has a right upper lobe nodule.  I have to believe that this is malignant even though the biopsy showed atypical epithelial proliferation.  I thought about maybe another biopsy.  Again, I know this is a very tenuous situation.  He does not need to have a pneumothorax which would definitely put him in a tough way.  We will see if Dr. Sondra Come will be able to offer him radiosurgery.  For right now, I will plan to get him back to see me in about 6 weeks.  If I need to get him back sooner I certainly can.   Volanda Napoleon, MD 6/1/20214:15 PM

## 2020-03-25 NOTE — Progress Notes (Signed)
Patient ID: Kobee Medlen Harrison                 DOB: June 26, 1951                      MRN: [id number]     HPI: Astin Sayre is a 69 y.o. male referred by Dr. Gardiner Rhyme to HTN clinic. PMH includes HFrEF 20% on September 2020, lung mass, and severe mitral regurgitation. Patient was unable to tolerate ACEI, ARB or ARNI in the past d/t hypotension. Dr Gardiner Rhyme added low dose losartan to carvedilol therapy and plan to add low dose spironolactone if patient able to tolerate.   Patient presents to clinic today and denies dizziness, increased fatigue, swelling or lightheadedness. Reports home BP in the 70 systolic, but no records available for assessment.   Current HTN meds:  Carvedilol 3.125mg  twice daily Losartan 25mg  daily Furosemide 40mg  in AM and 20mg  in PM  Previously tried:   BP goal: <130/80  Family History: The patient'sfamily history includes Alcoholism in his mother; CAD in his mother. There is no history of Heart failure.  Social History: denies hx of alcohol or tobacco use  Diet: balanced, decreased sodium  Exercise: walks his dog 3/x day  Home BP readings: none available systolic ~40J per patient recollection  Wt Readings from Last 3 Encounters:  03/25/20 181 lb (82.1 kg)  03/25/20 181 lb 3.2 oz (82.2 kg)  03/19/20 175 lb (79.4 kg)   BP Readings from Last 3 Encounters:  03/25/20 (!) 73/52  03/25/20 (!) 84/62  03/19/20 (!) 97/58   Pulse Readings from Last 3 Encounters:  03/25/20 74  03/25/20 60  03/19/20 70    Renal function: Estimated Creatinine Clearance: 69.6 mL/min (by C-G formula based on SCr of 1.02 mg/dL).  Past Medical History:  Diagnosis Date   Chronic systolic CHF (congestive heart failure) (HCC)     Current Outpatient Medications on File Prior to Visit  Medication Sig Dispense Refill   atorvastatin (LIPITOR) 20 MG tablet Take 1 tablet (20 mg total) by mouth daily. 90 tablet 3   pantoprazole (PROTONIX) 40 MG tablet Take 40 mg by mouth daily.       potassium chloride SA (KLOR-CON) 20 MEQ tablet Take 1 tablet (20 mEq total) by mouth daily. 90 tablet 1   No current facility-administered medications on file prior to visit.    No Known Allergies  Blood pressure (!) 84/62, pulse 60, height 5\' 10"  (1.778 m), weight 181 lb 3.2 oz (82.2 kg), SpO2 96 %.  Chronic systolic CHF (congestive heart failure), NYHA class 4 (HCC) Blood pressure low but patient remains asymptomatic. Will decrease losartan dose to 12.5mg  daily. Patient is NOT a good candidate for Entresto at this time due to low BP. Will decrease furosemide to 20mg  BID and follow up in 2 weeks for assessment and further mediation titration.     Elwyn Lowden Rodriguez-Guzman PharmD, BCPS, [place] [id number] 03/26/2020 5:04 PM

## 2020-03-26 ENCOUNTER — Encounter: Payer: Self-pay | Admitting: *Deleted

## 2020-03-26 ENCOUNTER — Encounter: Payer: Self-pay | Admitting: Pharmacist

## 2020-03-26 ENCOUNTER — Telehealth: Payer: Self-pay | Admitting: Hematology & Oncology

## 2020-03-26 DIAGNOSIS — R918 Other nonspecific abnormal finding of lung field: Secondary | ICD-10-CM

## 2020-03-26 NOTE — Progress Notes (Signed)
Oncology Nurse Navigator Documentation  Oncology Nurse Navigator Flowsheets 03/26/2020  Abnormal Finding Date -  Diagnosis Status -  Navigator Follow Up Date: 04/30/2020  Navigator Follow Up Reason: Follow-up Appointment  Navigator Location [facility]  Referral Date to RadOnc/MedOnc -  Navigator Encounter Type Appt/Treatment Plan Review  Telephone -  Patient Visit Type -  Treatment Phase Abnormal Scans  Barriers/Navigation Needs Coordination of Care  Education -  Interventions Referrals  Acuity Level 2-Minimal Needs (1-2 Barriers Identified)  Referrals Radiation Oncology  Coordination of Care -  Education Method -  Support Groups/Services -  Time Spent with Patient 30

## 2020-03-26 NOTE — Progress Notes (Signed)
Patient's biopsy came back non-diagnostic. When reviewing with Dr Marin Olp he requested that patient come in to see him. Called patient and scheduled an appointment for later today.

## 2020-03-26 NOTE — Assessment & Plan Note (Signed)
Blood pressure low but patient remains asymptomatic. Will decrease losartan dose to 12.5mg  daily. Patient is NOT a good candidate for Entresto at this time due to low BP. Will decrease furosemide to 20mg  BID and follow up in 2 weeks for assessment and further mediation titration.

## 2020-03-26 NOTE — Telephone Encounter (Signed)
Appointments scheduled calendar printed & mailed per 6/1 los

## 2020-04-03 ENCOUNTER — Telehealth: Payer: Self-pay | Admitting: *Deleted
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
## 2020-04-03 NOTE — Telephone Encounter (Signed)
Left message for patient to call to discuss scheduling the Cardiac MRI that has been ordered.

## 2020-04-14 ENCOUNTER — Ambulatory Visit (INDEPENDENT_AMBULATORY_CARE_PROVIDER_SITE_OTHER): Payer: Medicare Other | Admitting: Cardiology

## 2020-04-14 ENCOUNTER — Other Ambulatory Visit: Payer: Self-pay

## 2020-04-14 VITALS — BP 130/60 | HR 68 | Temp 97.2°F | Ht 70.0 in | Wt 185.6 lb

## 2020-04-14 DIAGNOSIS — Z79899 Other long term (current) drug therapy: Secondary | ICD-10-CM | POA: Diagnosis not present

## 2020-04-14 DIAGNOSIS — I5042 Chronic combined systolic (congestive) and diastolic (congestive) heart failure: Secondary | ICD-10-CM

## 2020-04-14 DIAGNOSIS — I77810 Thoracic aortic ectasia: Secondary | ICD-10-CM

## 2020-04-14 DIAGNOSIS — I340 Nonrheumatic mitral (valve) insufficiency: Secondary | ICD-10-CM

## 2020-04-14 NOTE — Progress Notes (Signed)
Cardiology Office Note:    Date:  04/16/2020   ID:  Philip Campbell, DOB 1951/04/30, MRN [id number]  PCP:  Libby Maw, MD  Cardiologist:  No primary care provider on file.  Electrophysiologist:  None   Referring MD: Libby Maw,*   Chief Complaint  Patient presents with  . Congestive Heart Failure    History of Present Illness:    Philip Campbell is a 69 y.o. male with a hx of chronic systolic heart failure (EF 20%), lung mass who presents for follow-up.  He was referred by Dr. Ethelene Hal for evaluation of heart failure, initially seen on 03/10/2020.  He was admitted to [facility] in [place] in September 2020 with shortness of breath.  Found to have EF 20%.  Cardiac catheterization showed nonobstructive disease.  Also found to have severe mitral regurgitation.  He was diuresed and started on Lasix, ACE inhibitor, and beta-blocker.  He had to stop the ACE inhibitor and beta-blocker due to low BPs.  He presented to [facility] ED on 01/23/2020 with worsening shortness of breath.  He was diuresed and started on Coreg.  ACE/ARB/Arni was not started due to low BP.  Also found to have lung mass, was referred to Dr. Marin Olp, PET scan planned.   Since last clinic visit, he reports that he has been doing well.  He denies any dyspnea, chest pain, or lower extremity edema.  States that he has been monitoring his weight at home and has been stable around 180 pounds.  Reports most activity he does is walking his dog or riding bicycle a few times.  Plans to start radiation therapy for lung mass.  He was unable to get his PET scan or cardiac MRI due to financial issues.  States he checks his BP at home and has been 110s to 120s over 60s to 70s, though has been down to 90s over 50s.  Denies any lightheadedness when BP running low    Past Medical History:  Diagnosis Date  . Chronic systolic CHF (congestive heart failure) (HCC)     Past Surgical History:  Procedure Laterality Date  .  ELBOW BURSA SURGERY    . GALLBLADDER SURGERY    . HERNIA REPAIR      Current Medications: Current Meds  Medication Sig  . atorvastatin (LIPITOR) 20 MG tablet Take 1 tablet (20 mg total) by mouth daily.  . carvedilol (COREG) 3.125 MG tablet Take 1 tablet (3.125 mg total) by mouth 2 (two) times daily with a meal.  . furosemide (LASIX) 20 MG tablet Take 1 tablet (20 mg total) by mouth 2 (two) times daily. May also take 1 tablet (20 mg total) daily as needed for fluid (for 3 or more punds ovenoight weight gain).  Marland Kitchen losartan (COZAAR) 25 MG tablet Take 0.5 tablets (12.5 mg total) by mouth daily.  . pantoprazole (PROTONIX) 40 MG tablet Take 40 mg by mouth daily.  . potassium chloride SA (KLOR-CON) 20 MEQ tablet Take 1 tablet (20 mEq total) by mouth daily.     Allergies:   Patient has no known allergies.   Social History   Socioeconomic History  . Marital status: Single    Spouse name: Not on file  . Number of children: Not on file  . Years of education: Not on file  . Highest education level: Not on file  Occupational History  . Not on file  Tobacco Use  . Smoking status: Never Smoker  . Smokeless tobacco: Never Used  Vaping Use  . Vaping Use: Never used  Substance and Sexual Activity  . Alcohol use: Never  . Drug use: Never  . Sexual activity: Not on file  Other Topics Concern  . Not on file  Social History Narrative  . Not on file   Social Determinants of Health   Financial Resource Strain:   . Difficulty of Paying Living Expenses:   Food Insecurity:   . Worried About Charity fundraiser in the Last Year:   . Arboriculturist in the Last Year:   Transportation Needs:   . Film/video editor (Medical):   Marland Kitchen Lack of Transportation (Non-Medical):   Physical Activity:   . Days of Exercise per Week:   . Minutes of Exercise per Session:   Stress:   . Feeling of Stress :   Social Connections:   . Frequency of Communication with Friends and Family:   . Frequency of  Social Gatherings with Friends and Family:   . Attends Religious Services:   . Active Member of Clubs or Organizations:   . Attends Archivist Meetings:   Marland Kitchen Marital Status:      Family History: The patient'sfamily history includes Alcoholism in his mother; CAD in his mother. There is no history of Heart failure.  ROS:   Please see the history of present illness.    All other systems reviewed and are negative.  EKGs/Labs/Other Studies Reviewed:    The following studies were reviewed today:   EKG:  EKG is ordered today.  The ekg ordered today demonstrates normal sinus rhythm, rate 68, LVH with repolarization abnormalities  TTE 01/24/20: 1. Severe global reduction in LV systolic function; severe LVE;  restrictive filling; mildly dilated aortic root (4.3 cm); mild AI:  moderate to severe MR; biatrial enlargement; moderate RV dysfunction; mild  TR.  2. Left ventricular ejection fraction, by estimation, is <20%. The left  ventricle has severely decreased function. The left ventricle demonstrates  global hypokinesis. The left ventricular internal cavity size was severely  dilated. Left ventricular  diastolic parameters are consistent with Grade III diastolic dysfunction  (restrictive). Elevated left atrial pressure.  3. Right ventricular systolic function is moderately reduced. The right  ventricular size is normal. There is mildly elevated pulmonary artery  systolic pressure.  4. Left atrial size was severely dilated.  5. Right atrial size was moderately dilated.  6. The mitral valve is normal in structure. Moderate to severe mitral  valve regurgitation. No evidence of mitral stenosis.  7. The aortic valve is tricuspid. Aortic valve regurgitation is mild.  Mild aortic valve sclerosis is present, with no evidence of aortic valve  stenosis.  8. Aortic dilatation noted. There is mild dilatation of the aortic root  measuring 43 mm.  9. The inferior vena cava is  normal in size with greater than 50%   Recent Labs: 01/23/2020: B Natriuretic Peptide 1,845.6 02/27/2020: ALT 18 03/19/2020: Hemoglobin 14.6; Platelets 176 04/14/2020: BUN 22; Creatinine, Ser 0.96; Magnesium 1.8; Potassium 5.0; Sodium 141  Recent Lipid Panel    Component Value Date/Time   CHOL 181 02/21/2020 1425   TRIG 166.0 (H) 02/21/2020 1425   HDL 46.70 02/21/2020 1425   CHOLHDL 4 02/21/2020 1425   VLDL 33.2 02/21/2020 1425   LDLCALC 101 (H) 02/21/2020 1425   LDLDIRECT 116.0 02/21/2020 1425    Physical Exam:    VS:  BP 130/60   Pulse 68   Temp (!) 97.2 F (36.2 C)  Ht 5\' 10"  (1.778 m)   Wt 185 lb 9.6 oz (84.2 kg)   SpO2 97%   BMI 26.63 kg/m     Wt Readings from Last 3 Encounters:  04/14/20 185 lb 9.6 oz (84.2 kg)  03/25/20 181 lb (82.1 kg)  03/25/20 181 lb 3.2 oz (82.2 kg)     GEN: in no acute distress HEENT: Normal NECK: No JVD; No carotid bruits CARDIAC: RRR, 2 out of 6 systolic murmur RESPIRATORY:  Clear to auscultation without rales, wheezing or rhonchi  ABDOMEN: Soft, non-tender, non-distended MUSCULOSKELETAL:  No edema SKIN: Warm and dry NEUROLOGIC:  Alert and oriented x 3 PSYCHIATRIC:  Normal affect   ASSESSMENT:    1. Chronic combined systolic and diastolic heart failure ([place])   2. Medication management   3. Mitral valve insufficiency, unspecified etiology   4. Aortic root dilatation (HCC)    PLAN:    Chronic combined systolic and diastolic heart failure: EF less than 20% on TTE 01/24/2020.  Grade 3 diastolic dysfunction.  Cardiac catheterization in [place] on 08/07/2019 showed mild nonobstructive CAD.  GDMT has been limited by low blood pressure -Recommended cardiac MRI to further evaluate etiology of cardiomyopathy, however patient reports he is unable to afford.  Will ask social worker to reach out to him about available resources -Continue Lasix 20 mg BID.  Appears euvolemic.  Will check BMP/magnesium -Continue Coreg 3.125 mg twice  daily -Continue losartan 12.5 mg daily -If stable electrolytes/renal function, will plan to add spironolactone 12.5 mg daily -Follow-up in pharmacy clinic in 2 weeks for further medication titration  -Given low blood pressure has limited ability to start GDMT, will refer to advanced heart failure for evaluation  Mitral regurgitation: Moderate to severe on TTE 01/24/2020.  Appears functional.  Will quantify degree of regurgitation on CMR as above.  Aortic root dilatation: Measures 43 mm.  Can evaluate aorta on MRI as above  Lung mass: Follows with oncology, PET scan planned   Medication Adjustments/Labs and Tests Ordered: Current medicines are reviewed at length with the patient today.  Concerns regarding medicines are outlined above.  Orders Placed This Encounter  Procedures  . Basic metabolic panel  . Magnesium  . AMB referral to CHF clinic  . EKG 12-Lead   No orders of the defined types were placed in this encounter.   Patient Instructions  Medication Instructions:  Your physician recommends that you continue on your current medications as directed. Please refer to the Current Medication list given to you today.  *If you need a refill on your cardiac medications before your next appointment, please call your pharmacy*   Lab Work: BMET, [place] today  If you have labs (blood work) drawn today and your tests are completely normal, you will receive your results only by: Marland Kitchen MyChart Message (if you have MyChart) OR . A paper copy in the mail If you have any lab test that is abnormal or we need to change your treatment, we will call you to review the results.  Follow-Up: At [facility], you and your health needs are our priority.  As part of our continuing mission to provide you with exceptional heart care, we have created designated Provider Care Teams.  These Care Teams include your primary Cardiologist (physician) and Advanced Practice Providers (APPs -  Physician Assistants and  Nurse Practitioners) who all work together to provide you with the care you need, when you need it.  We recommend signing up for the patient portal called "MyChart".  Sign up information is provided on this After Visit Summary.  MyChart is used to connect with patients for Virtual Visits (Telemedicine).  Patients are able to view lab/test results, encounter notes, upcoming appointments, etc.  Non-urgent messages can be sent to your provider as well.   To learn more about what you can do with MyChart, go to [email or web address].    Your next appointment:    2 weeks with pharmacist (med titration) 6 weeks with Dr. Joya San have been referred to : Heart failure clinic at [place], Donato Heinz, MD  04/16/2020 5:40 PM    [place]

## 2020-04-14 NOTE — Patient Instructions (Signed)
Medication Instructions:  Your physician recommends that you continue on your current medications as directed. Please refer to the Current Medication list given to you today.  *If you need a refill on your cardiac medications before your next appointment, please call your pharmacy*   Lab Work: BMET, [place] today  If you have labs (blood work) drawn today and your tests are completely normal, you will receive your results only by: Marland Kitchen MyChart Message (if you have MyChart) OR . A paper copy in the mail If you have any lab test that is abnormal or we need to change your treatment, we will call you to review the results.  Follow-Up: At [facility], you and your health needs are our priority.  As part of our continuing mission to provide you with exceptional heart care, we have created designated Provider Care Teams.  These Care Teams include your primary Cardiologist (physician) and Advanced Practice Providers (APPs -  Physician Assistants and Nurse Practitioners) who all work together to provide you with the care you need, when you need it.  We recommend signing up for the patient portal called "MyChart".  Sign up information is provided on this After Visit Summary.  MyChart is used to connect with patients for Virtual Visits (Telemedicine).  Patients are able to view lab/test results, encounter notes, upcoming appointments, etc.  Non-urgent messages can be sent to your provider as well.   To learn more about what you can do with MyChart, go to [email or web address].    Your next appointment:    2 weeks with pharmacist (med titration) 6 weeks with Dr. Joya San have been referred to : Heart failure clinic at [facility]

## 2020-04-15 LAB — BASIC METABOLIC PANEL
BUN/Creatinine Ratio: 23 (ref 10–24)
BUN: 22 mg/dL (ref 8–27)
CO2: 26 mmol/L (ref 20–29)
Calcium: 9.5 mg/dL (ref 8.6–10.2)
Chloride: 102 mmol/L (ref 96–106)
Creatinine, Ser: 0.96 mg/dL (ref 0.76–1.27)
GFR calc Af Amer: 92 mL/min/{1.73_m2} (ref >59)
GFR calc non Af Amer: 80 mL/min/{1.73_m2} (ref >59)
Glucose: 63 mg/dL — ABNORMAL LOW (ref 65–99)
Potassium: 5 mmol/L (ref 3.5–5.2)
Sodium: 141 mmol/L (ref 134–144)

## 2020-04-15 LAB — MAGNESIUM: Magnesium: 1.8 mg/dL (ref 1.6–2.3)

## 2020-04-16 NOTE — Progress Notes (Signed)
Patient here for a consult with Dr. Sondra Come.  Office Visit  03/25/2020 [place]   Ennever, Rudell Cobb, MD Oncology  Mass of upper lobe of right lung Dx  Referred by Libby Maw, MD Reason for Visit  Additional Documentation  Vitals:  BP 73/52Important (BP Location: Right Arm, Patient Position: Sitting)   Pulse 74  Temp 97.7 F (36.5 C) (Temporal)  Resp 18  Wt 181 lb (82.1 kg)  SpO2 97%  BMI 25.97 kg/m  BSA 2.01 m  Pain Philip Campbell 0-No pain    More Vitals  Flowsheets:  Healthcare Directives,  ONC AMB COMPLEX VITALS,  Vital Signs,  NEWS,  MEWS Score,  Anthropometrics,  Collaborative Documentation,  Method of Visit    Encounter Info:  Billing Info,  History,  Allergies,  Detailed Report    Orthostatic Vitals Recorded in This Encounter   03/25/2020  1518     Patient Position: Sitting  BP Location: Right Arm  All Notes  Progress Notes by Volanda Napoleon, MD at 03/25/2020 3:15 PM Author: Volanda Napoleon, MD Author Type: Physician Filed: 03/25/2020 4:32 PM  Note Status: Signed Cosign: Cosign Not Required Encounter Date: 03/25/2020  Editor: Volanda Napoleon, MD (Physician)              Expand AllCollapse All    Hematology and Oncology Follow Up Visit  Philip Campbell [id number] [birth year] 69 y.o. 03/25/2020   Principle Diagnosis:   Right upper lung nodule  Current Therapy:         Observation                                      Interim History:  Philip Campbell is back for follow-up.  We saw him back in early May.  At that time, he presented with a nodule in the right upper lung.  He is a none smoker.  He did have a biopsy done on May 25.  The pathology report ([id number]) showed atypical epithelial proliferation.  This clearly was a sampling issue.  He feels well.  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  I know he is followed by cardiology.  He has had no problems  with cough or shortness of breath.  He has had no hemoptysis.  Is been no nausea or vomiting.  He has had no change in bowel or bladder habits.  He did have a nice Memorial Day weekend.  I did speak with Dr. Sondra Come of Radiation Oncology.  He will see Philip Campbell and try to determine if stereotactic radiosurgery might be an option.  Philip Campbell is not able to have a PET scan right now because of financial issues.  Currently, I would have to say his performance status is probably ECOG 1.  Medications:  Current Outpatient Medications:  .  atorvastatin (LIPITOR) 20 MG tablet, Take 1 tablet (20 mg total) by mouth daily., Disp: 90 tablet, Rfl: 3 .  carvedilol (COREG) 3.125 MG tablet, Take 1 tablet (3.125 mg total) by mouth 2 (two) times daily with a meal., Disp: 180 tablet, Rfl: 1 .  furosemide (LASIX) 20 MG tablet, Take 1 tablet (20 mg total) by mouth 2 (two) times daily. May also take 1 tablet (20 mg total) daily as needed for fluid (for 3 or more punds ovenoight weight gain)., Disp:  90 tablet, Rfl: 1 .  losartan (COZAAR) 25 MG tablet, Take 0.5 tablets (12.5 mg total) by mouth daily., Disp: 30 tablet, Rfl: 3 .  pantoprazole (PROTONIX) 40 MG tablet, Take 40 mg by mouth daily., Disp: , Rfl:  .  potassium chloride SA (KLOR-CON) 20 MEQ tablet, Take 1 tablet (20 mEq total) by mouth daily., Disp: 90 tablet, Rfl: 1  Allergies: No Known Allergies  Past Medical History, Surgical history, Social history, and Family History were reviewed and updated.  Review of Systems: Review of Systems  Constitutional: Negative.   HENT:  Negative.   Eyes: Negative.   Respiratory: Negative.   Cardiovascular: Negative.   Gastrointestinal: Negative.   Endocrine: Negative.   Genitourinary: Negative.    Musculoskeletal: Negative.   Skin: Negative.   Neurological: Negative.   Hematological: Negative.   Psychiatric/Behavioral: Negative.     Physical Exam:  weight is 181 lb (82.1 kg). His temporal temperature  is 97.7 F (36.5 C). His blood pressure is 73/52 (abnormal) and his pulse is 74. His respiration is 18 and oxygen saturation is 97%.      Wt Readings from Last 3 Encounters:  03/25/20 181 lb (82.1 kg)  03/25/20 181 lb 3.2 oz (82.2 kg)  03/19/20 175 lb (79.4 kg)    Physical Exam Vitals reviewed.  HENT:     Head: Normocephalic and atraumatic.  Eyes:     Pupils: Pupils are equal, round, and reactive to light.  Cardiovascular:     Rate and Rhythm: Normal rate and regular rhythm.     Heart sounds: Normal heart sounds.  Pulmonary:     Effort: Pulmonary effort is normal.     Breath sounds: Normal breath sounds.  Abdominal:     General: Bowel sounds are normal.     Palpations: Abdomen is soft.  Musculoskeletal:        General: No tenderness or deformity. Normal range of motion.     Cervical back: Normal range of motion.  Lymphadenopathy:     Cervical: No cervical adenopathy.  Skin:    General: Skin is warm and dry.     Findings: No erythema or rash.  Neurological:     Mental Status: He is alert and oriented to person, place, and time.  Psychiatric:        Behavior: Behavior normal.        Thought Content: Thought content normal.        Judgment: Judgment normal.      Recent Labs       Lab Results  Component Value Date   WBC 5.8 03/19/2020   HGB 14.6 03/19/2020   HCT 45.0 03/19/2020   MCV 87.4 03/19/2020   PLT 176 03/19/2020       Chemistry   Labs (Brief)          Component Value Date/Time   NA 140 03/10/2020 1537   K 4.5 03/10/2020 1537   CL 96 03/10/2020 1537   CO2 26 03/10/2020 1537   BUN 21 03/10/2020 1537   CREATININE 1.02 03/10/2020 1537   CREATININE 1.09 02/27/2020 1027     Labs (Brief)     Component Value Date/Time   CALCIUM 9.7 03/10/2020 1537   ALKPHOS 85 02/27/2020 1027   AST 22 02/27/2020 1027   ALT 18 02/27/2020 1027   BILITOT 0.8 02/27/2020 1027        Impression and Plan: Philip Campbell is a 69-year-old white  male.  He has a right upper lobe nodule.  I have to believe that this is malignant even though the biopsy showed atypical epithelial proliferation.  I thought about maybe another biopsy.  Again, I know this is a very tenuous situation.  He does not need to have a pneumothorax which would definitely put him in a tough way.  We will see if Dr. Sondra Come will be able to offer him radiosurgery.  For right now, I will plan to get him back to see me in about 6 weeks.  If I need to get him back sooner I certainly can.   Volanda Napoleon, MD 6/1/20214:15 PM    Instructions    Return in about 5 weeks (around 04/29/2020) for md labs, 68min appt.  After Visit Summary (Automatic SnapShot taken 03/25/2020) Communications    [facility] Provider CC Chart Rep sent to Libby Maw, MD Media From this encounter Electronic signature on 03/25/2020 2:57 PM - E-signed  Electronic signature on 03/25/2020 2:57 PM - E-signed  Communication Routing History  Recipient Method Sent by Date Sent  Libby Maw, MD In Irven Shelling, MD 03/25/2020     Forde Dandy (Oldest Message First) Volanda Napoleon, MD     03/25/20 4:31 PM Note Expand AllCollapse All    Hematology and Oncology Follow Up Visit  Philip Campbell [id number] 1950/12/11 69 y.o. 03/25/2020   Principle Diagnosis:   Right upper lung nodule  Current Therapy:         Observation                                      Interim History:  Mr. Burzynski is back for follow-up.  We saw him back in early May.  At that time, he presented with a nodule in the right upper lung.  He is a none smoker.  He did have a biopsy done on May 25.  The pathology report ([id number]) showed atypical epithelial proliferation.  This clearly was a sampling issue.  He feels well.  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  I know he is followed by cardiology.  He has had no problems with cough  or shortness of breath.  He has had no hemoptysis.  Is been no nausea or vomiting.  He has had no change in bowel or bladder habits.  He did have a nice Memorial Day weekend.  I did speak with Dr. Sondra Come of Radiation Oncology.  He will see Mr. Burkey and try to determine if stereotactic radiosurgery might be an option.  Mr. Harcum is not able to have a PET scan right now because of financial issues.  Currently, I would have to say his performance status is probably ECOG 1.  Medications:  Current Outpatient Medications:  .  atorvastatin (LIPITOR) 20 MG tablet, Take 1 tablet (20 mg total) by mouth daily., Disp: 90 tablet, Rfl: 3 .  carvedilol (COREG) 3.125 MG tablet, Take 1 tablet (3.125 mg total) by mouth 2 (two) times daily with a meal., Disp: 180 tablet, Rfl: 1 .  furosemide (LASIX) 20 MG tablet, Take 1 tablet (20 mg total) by mouth 2 (two) times daily. May also take 1 tablet (20 mg total) daily as needed for fluid (for 3 or more punds ovenoight weight gain)., Disp: 90 tablet, Rfl: 1 .  losartan (COZAAR) 25 MG tablet, Take 0.5 tablets (12.5 mg total) by mouth  daily., Disp: 30 tablet, Rfl: 3 .  pantoprazole (PROTONIX) 40 MG tablet, Take 40 mg by mouth daily., Disp: , Rfl:  .  potassium chloride SA (KLOR-CON) 20 MEQ tablet, Take 1 tablet (20 mEq total) by mouth daily., Disp: 90 tablet, Rfl: 1  Allergies: No Known Allergies  Past Medical History, Surgical history, Social history, and Family History were reviewed and updated.  Review of Systems: Review of Systems  Constitutional: Negative.   HENT:  Negative.   Eyes: Negative.   Respiratory: Negative.   Cardiovascular: Negative.   Gastrointestinal: Negative.   Endocrine: Negative.   Genitourinary: Negative.    Musculoskeletal: Negative.   Skin: Negative.   Neurological: Negative.   Hematological: Negative.   Psychiatric/Behavioral: Negative.     Physical Exam:  weight is 181 lb (82.1 kg). His temporal temperature is 97.7 F  (36.5 C). His blood pressure is 73/52 (abnormal) and his pulse is 74. His respiration is 18 and oxygen saturation is 97%.   Wt Readings from Last 3 Encounters:  03/25/20 181 lb (82.1 kg)  03/25/20 181 lb 3.2 oz (82.2 kg)  03/19/20 175 lb (79.4 kg)    Physical Exam Vitals reviewed.  HENT:     Head: Normocephalic and atraumatic.  Eyes:     Pupils: Pupils are equal, round, and reactive to light.  Cardiovascular:     Rate and Rhythm: Normal rate and regular rhythm.     Heart sounds: Normal heart sounds.  Pulmonary:     Effort: Pulmonary effort is normal.     Breath sounds: Normal breath sounds.  Abdominal:     General: Bowel sounds are normal.     Palpations: Abdomen is soft.  Musculoskeletal:        General: No tenderness or deformity. Normal range of motion.     Cervical back: Normal range of motion.  Lymphadenopathy:     Cervical: No cervical adenopathy.  Skin:    General: Skin is warm and dry.     Findings: No erythema or rash.  Neurological:     Mental Status: He is alert and oriented to person, place, and time.  Psychiatric:        Behavior: Behavior normal.        Thought Content: Thought content normal.        Judgment: Judgment normal.      Recent Labs       Lab Results  Component Value Date   WBC 5.8 03/19/2020   HGB 14.6 03/19/2020   HCT 45.0 03/19/2020   MCV 87.4 03/19/2020   PLT 176 03/19/2020       Chemistry   Labs (Brief)          Component Value Date/Time   NA 140 03/10/2020 1537   K 4.5 03/10/2020 1537   CL 96 03/10/2020 1537   CO2 26 03/10/2020 1537   BUN 21 03/10/2020 1537   CREATININE 1.02 03/10/2020 1537   CREATININE 1.09 02/27/2020 1027     Labs (Brief)          Component Value Date/Time   CALCIUM 9.7 03/10/2020 1537   ALKPHOS 85 02/27/2020 1027   AST 22 02/27/2020 1027   ALT 18 02/27/2020 1027   BILITOT 0.8 02/27/2020 1027        Impression and Plan: Mr. Bacchi is a 77-year-old white male.   He has a right upper lobe nodule.  I have to believe that this is malignant even though the biopsy showed atypical epithelial proliferation.  I thought about maybe another biopsy.  Again, I know this is a very tenuous situation.  He does not need to have a pneumothorax which would definitely put him in a tough way.  We will see if Dr. Sondra Come will be able to offer him radiosurgery.  For right now, I will plan to get him back to see me in about 6 weeks.  If I need to get him back sooner I certainly can.   Volanda Napoleon, MD      Past/Anticipated interventions by cardiothoracic surgery, if any: none  Past/Anticipated interventions by medical oncology, if any: none  Signs/Symptoms  Weight changes, gained 15 lbs in 6 months  Respiratory complaints, if any: none  Hemoptysis, if any: no  Pain issues, if any: no  SAFETY ISSUES:  Prior radiation? no  Pacemaker/ICD? no  Possible current pregnancy?n/a  Is the patient on methotrexate? n/a   BP 101/67 (BP Location: Left Arm, Patient Position: Sitting, Cuff Size: Normal)   Pulse 71   Temp 97.9 F (36.6 C)   Resp 20   Ht 5\' 10"  (1.778 m)   Wt 185 lb 6.4 oz (84.1 kg)   SpO2 100%   BMI 26.60 kg/m    Wt Readings from Last 3 Encounters:  04/21/20 185 lb 6.4 oz (84.1 kg)  04/14/20 185 lb 9.6 oz (84.2 kg)  03/25/20 181 lb (82.1 kg)

## 2020-04-17 ENCOUNTER — Other Ambulatory Visit: Payer: Self-pay | Admitting: *Deleted

## 2020-04-17 MED ORDER — METOPROLOL SUCCINATE ER 25 MG PO TB24
25.0000 mg | ORAL_TABLET | Freq: Every day | ORAL | 3 refills | Status: DC
Start: 2020-04-17 — End: 2020-09-25

## 2020-04-20 NOTE — Progress Notes (Signed)
Radiation Oncology         [phone] ________________________________  Initial Outpatient Consultation  Name: Philip Campbell MRN: [id number]  Date: 04/21/2020  DOB: [birth year]  WU:[id number], Mortimer Fries, MD  Volanda Napoleon, MD   REFERRING PHYSICIAN: Volanda Napoleon, MD  DIAGNOSIS: The encounter diagnosis was Mass of upper lobe of right lung.  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 male who is seen as a courtesy of Dr. Marin Olp for an opinion concerning radiation therapy as part of management for his recently diagnosed right upper lung nodule. Today, he is accompanied by no one. The patient presented to the ED on 01/23/2020 with complaints of fatigue and shortness of breath. Chest x-ray at that time showed a slightly ill-defined rounded opacity within the peripheral aspect of the right upper lobe. It also showed cardiomegaly with pulmonary vascular congestion and interstitial prominence most consistent with CHF. The patient was admitted to the hospital for further management and treatment.  The patient had a chest CT scan on 01/23/2020 to follow-up on the right upper lobe opacity seen on the chest x-ray. CT results showed a 3.8 x 2.0 x 1.8 cm mass in the right upper lobe that was highly concerning for primary bronchogenic adenocarcinoma. It also showed cardiomegaly with evidence of mild interstitial pulmonary edema and trace bilateral pleural effusions suggestive of CHF. Finally, there was noted to be aortic atherosclerosis in addition to calcifications of the aortic valve and left main and 2 vessel coronary artery disease.  While admitted, the patient underwent an echocardiogram on 01/24/2020 that showed an EF of <20%. On that same day, he was evaluated by Dr. Carlis Abbott, who recommended a biopsy to confirm the diagnosis of the patient's newly found mass. They discussed endobronchial navigational biopsy and EBUS  vs IR-guided biopsy. Given his severe heart failure, she doubted that the patient would be a surgical candidate for lobectomy at any point in the near future. Positive lymph nodes in the mediastinum would preclude being a surgical candidate as well. She also recommended a PET scan and brain MRI to evaluate for metastatic disease and to help with staging and potentially offer less invasive sites for biopsy to provide both staging and diagnosis.  On 01/25/2020, Dr. Anselm Pancoast reviewed the patient's imaging studies and felt that the lesion could be infection. Thus, he recommended either bronchoscopy or short-term follow-up rather than CT-guided biopsy. The patient was discharged from the hospital on 01/26/2020.  The patient was referred to Dr. Marin Olp and was seen in consultation on 02/27/2020. At that time, it was recommended that the patient proceed with PET scan followed by biopsy. He was scheduled to have it done on 03/11/2020 but refused due to out-of-pocket cost of co-pay.   The patient underwent a biopsy on 03/19/2020 that showed atypical epithelial proliferation of the right upper lobe. Following this, the patient was seen by Dr. Marin Olp on 03/25/2020, who believed that there was a sampling error and that the patient's nodule is malignant. A second biopsy was considered but the patient's situation is very tenuous and he is high risk given his recent cardiac history.  Of note, the patient is being followed closely by cardiology and was last seen by Dr. Gardiner Rhyme on 04/14/2020. A cardiac MRI was recommended but the patient cannot afford it at this time.  PREVIOUS RADIATION THERAPY: No  PAST MEDICAL HISTORY:  Past Medical History:  Diagnosis Date  . Chronic  systolic CHF (congestive heart failure) ([place])     PAST SURGICAL HISTORY: Past Surgical History:  Procedure Laterality Date  . ELBOW BURSA SURGERY    . GALLBLADDER SURGERY    . HERNIA REPAIR      FAMILY HISTORY:  Family History  Problem  Relation Age of Onset  . CAD Mother        CABG x3 in her 63's  . Alcoholism Mother   . Heart failure Neg Hx     SOCIAL HISTORY:  Social History   Tobacco Use  . Smoking status: Never Smoker  . Smokeless tobacco: Never Used  Vaping Use  . Vaping Use: Never used  Substance Use Topics  . Alcohol use: Never  . Drug use: Never    ALLERGIES: No Known Allergies  MEDICATIONS:  Current Outpatient Medications  Medication Sig Dispense Refill  . atorvastatin (LIPITOR) 20 MG tablet Take 1 tablet (20 mg total) by mouth daily. 90 tablet 3  . furosemide (LASIX) 20 MG tablet Take 1 tablet (20 mg total) by mouth 2 (two) times daily. May also take 1 tablet (20 mg total) daily as needed for fluid (for 3 or more punds ovenoight weight gain). 90 tablet 1  . losartan (COZAAR) 25 MG tablet Take 0.5 tablets (12.5 mg total) by mouth daily. 30 tablet 3  . metoprolol succinate (TOPROL XL) 25 MG 24 hr tablet Take 1 tablet (25 mg total) by mouth daily. 30 tablet 3  . pantoprazole (PROTONIX) 40 MG tablet Take 40 mg by mouth daily.    . potassium chloride SA (KLOR-CON) 20 MEQ tablet Take 1 tablet (20 mEq total) by mouth daily. 90 tablet 1   No current facility-administered medications for this encounter.    REVIEW OF SYSTEMS:  A 10+ POINT REVIEW OF SYSTEMS WAS OBTAINED including neurology, dermatology, psychiatry, cardiac, respiratory, lymph, extremities, GI, GU, musculoskeletal, constitutional, reproductive, HEENT.  He denies any hemoptysis.   PHYSICAL EXAM:  height is 5\' 10"  (1.778 m) and weight is 185 lb 6.4 oz (84.1 kg). His temperature is 97.9 F (36.6 C). His blood pressure is 101/67 and his pulse is 71. His respiration is 20 and oxygen saturation is 100%.   General: Alert and oriented, in no acute distress HEENT: Head is normocephalic. Extraocular movements are intact.  Neck: Neck is supple, no palpable cervical or supraclavicular lymphadenopathy. Heart: Regular in rate and rhythm with no murmurs,  rubs, or gallops. Chest: Clear to auscultation bilaterally, with no rhonchi, wheezes, or rales. Abdomen: Soft, nontender, nondistended, with no rigidity or guarding. Extremities: No cyanosis or edema. Lymphatics: see Neck Exam Skin: No concerning lesions. Musculoskeletal: symmetric strength and muscle tone throughout. Neurologic: Cranial nerves II through XII are grossly intact. No obvious focalities. Speech is fluent. Coordination is intact. Psychiatric: Judgment and insight are intact. Affect is appropriate.   ECOG = 1  0 - Asymptomatic (Fully active, able to carry on all predisease activities without restriction)  1 - Symptomatic but completely ambulatory (Restricted in physically strenuous activity but ambulatory and able to carry out work of a light or sedentary nature. For example, light housework, office work)  2 - Symptomatic, <50% in bed during the day (Ambulatory and capable of all self care but unable to carry out any work activities. Up and about more than 50% of waking hours)  3 - Symptomatic, >50% in bed, but not bedbound (Capable of only limited self-care, confined to bed or chair 50% or more of waking hours)  4 -  Bedbound (Completely disabled. Cannot carry on any self-care. Totally confined to bed or chair)  5 - Death   Eustace Pen MM, Creech RH, Tormey DC, et al. [phone]). "Toxicity and response criteria of the [facility]". [place] Oncol. 5 (6): 649-55  LABORATORY DATA:  Lab Results  Component Value Date   WBC 5.8 03/19/2020   HGB 14.6 03/19/2020   HCT 45.0 03/19/2020   MCV 87.4 03/19/2020   PLT 176 03/19/2020   NEUTROABS 3.0 02/27/2020   Lab Results  Component Value Date   NA 141 04/14/2020   K 5.0 04/14/2020   CL 102 04/14/2020   CO2 26 04/14/2020   GLUCOSE 63 (L) 04/14/2020   CREATININE 0.96 04/14/2020   CALCIUM 9.5 04/14/2020      RADIOGRAPHY: No results found.    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  As above patient has an ejection fraction of less than 20%.  He did develop a small pneumothorax after his first attempted biopsy and in light of this the patient does not wish to consider repeat biopsy to confirm diagnosis.  Patient has financial issues and is not a candidate for cardiac MRI or PET scan for further evaluation.  We discussed potential treatment without tissue diagnosis.  Patient does wish to proceed with treatment knowing that he most likely to has  non-small cell lung cancer.  Today, I talked to the patient  about the findings and work-up thus far.  We discussed the natural history of lung cancer and general treatment, highlighting the role of radiotherapy (SBRT) in the management.  We discussed the available radiation techniques, and focused on the details of logistics and delivery.  We reviewed the anticipated acute and late sequelae associated with radiation in this setting.  The patient was encouraged to ask questions that I answered to the best of my ability.  A patient consent form was discussed and signed.  We retained a copy for our records.  The patient would like to proceed with radiation and will be scheduled for CT simulation.  PLAN: SBRT simulation in the near future.  Anticipate between 3 and 5 radiation treatments.  Total time spent in this encounter was 65 minutes which included reviewing the patient's most recent ED/hospital visit, imaging, biopsy, physical examination, and documentation.   ------------------------------------------------  Blair Promise, PhD, MD  This document serves as a record of services personally performed by Gery Pray, MD. It was created on his behalf by Clerance Lav, a trained medical scribe. The creation of this record is based on the scribe's personal observations and the provider's statements to them. This document has been checked and  approved by the attending provider.

## 2020-04-21 ENCOUNTER — Encounter: Payer: Self-pay | Admitting: Radiation Oncology

## 2020-04-21 ENCOUNTER — Ambulatory Visit
Admission: RE | Admit: 2020-04-21 | Discharge: 2020-04-21 | Disposition: A | Discharge status: Home / Self Care | Payer: Medicare Other | Source: Ambulatory Visit | Attending: Radiation Oncology | Admitting: Radiation Oncology

## 2020-04-21 ENCOUNTER — Other Ambulatory Visit: Payer: Self-pay

## 2020-04-21 DIAGNOSIS — J811 Chronic pulmonary edema: Secondary | ICD-10-CM | POA: Diagnosis not present

## 2020-04-21 DIAGNOSIS — R918 Other nonspecific abnormal finding of lung field: Secondary | ICD-10-CM | POA: Diagnosis not present

## 2020-04-21 DIAGNOSIS — Z79899 Other long term (current) drug therapy: Secondary | ICD-10-CM | POA: Insufficient documentation

## 2020-04-21 DIAGNOSIS — I509 Heart failure, unspecified: Secondary | ICD-10-CM | POA: Insufficient documentation

## 2020-04-21 DIAGNOSIS — I5022 Chronic systolic (congestive) heart failure: Secondary | ICD-10-CM | POA: Diagnosis not present

## 2020-04-21 DIAGNOSIS — J90 Pleural effusion, not elsewhere classified: Secondary | ICD-10-CM | POA: Diagnosis not present

## 2020-04-21 DIAGNOSIS — I517 Cardiomegaly: Secondary | ICD-10-CM | POA: Insufficient documentation

## 2020-04-21 DIAGNOSIS — I700 Atherosclerosis of aorta: Secondary | ICD-10-CM | POA: Diagnosis not present

## 2020-04-22 NOTE — Progress Notes (Signed)
°  Radiation Oncology         [phone]) [phone] ________________________________  Name: Philip Campbell MRN: [id number]  Date: 04/23/2020  DOB: [birth year]   STEREOTACTIC BODY RADIOTHERAPY SIMULATION AND TREATMENT PLANNING NOTE    DIAGNOSIS: Right upper lung nodule, presumed non-small cell lung cancer  NARRATIVE:  The patient was brought to the [facility].  Identity was confirmed.  All relevant records and images related to the planned course of therapy were reviewed.  The patient freely provided informed written consent to proceed with treatment after reviewing the details related to the planned course of therapy. The consent form was witnessed and verified by the simulation staff.  Then, the patient was set-up in a stable reproducible  supine position for radiation therapy.  A BodyFix immobilization pillow was fabricated for reproducible positioning.  Then I personally applied the abdominal compression paddle to limit respiratory excursion.  4D respiratoy motion management CT images were obtained.  Surface markings were placed.  The CT images were loaded into the planning software.  Then, using Cine, MIP, and standard views, the internal target volume (ITV) and planning target volumes (PTV) were delinieated, and avoidance structures were contoured.  Treatment planning then occurred.  The radiation prescription was entered and confirmed.  A total of two complex treatment devices were fabricated in the form of the BodyFix immobilization pillow and a neck accuform cushion.  I have requested : 3D Simulation  I have requested a DVH of the following structures: Heart, Lungs, Esophagus, Chest Wall, Brachial Plexus, Major Blood Vessels, and targets.  PLAN:  The patient will receive 54 Gy in 3 fractions.  -----------------------------------  Blair Promise, PhD, MD  This document serves as a record of services personally performed by Gery Pray, MD. It was created on his behalf by  Clerance Lav, a trained medical scribe. The creation of this record is based on the scribe's personal observations and the provider's statements to them. This document has been checked and approved by the attending provider.

## 2020-04-23 ENCOUNTER — Ambulatory Visit
Admission: RE | Admit: 2020-04-23 | Discharge: 2020-04-23 | Disposition: A | Discharge status: Home / Self Care | Payer: Medicare Other | Source: Ambulatory Visit | Attending: Radiation Oncology | Admitting: Radiation Oncology

## 2020-04-23 ENCOUNTER — Encounter: Payer: Self-pay | Admitting: Hematology & Oncology

## 2020-04-23 ENCOUNTER — Other Ambulatory Visit: Payer: Self-pay

## 2020-04-23 ENCOUNTER — Other Ambulatory Visit: Payer: Self-pay | Admitting: Hematology & Oncology

## 2020-04-23 DIAGNOSIS — Z7189 Other specified counseling: Secondary | ICD-10-CM

## 2020-04-23 DIAGNOSIS — R911 Solitary pulmonary nodule: Secondary | ICD-10-CM | POA: Diagnosis not present

## 2020-04-23 DIAGNOSIS — C3411 Malignant neoplasm of upper lobe, right bronchus or lung: Secondary | ICD-10-CM | POA: Diagnosis not present

## 2020-04-23 DIAGNOSIS — R918 Other nonspecific abnormal finding of lung field: Secondary | ICD-10-CM

## 2020-04-23 HISTORY — DX: Other specified counseling: Z71.89

## 2020-04-24 DIAGNOSIS — R911 Solitary pulmonary nodule: Secondary | ICD-10-CM | POA: Diagnosis not present

## 2020-04-24 DIAGNOSIS — C3411 Malignant neoplasm of upper lobe, right bronchus or lung: Secondary | ICD-10-CM | POA: Diagnosis not present

## 2020-04-30 ENCOUNTER — Inpatient Hospital Stay (HOSPITAL_BASED_OUTPATIENT_CLINIC_OR_DEPARTMENT_OTHER): Payer: Medicare Other | Admitting: Hematology & Oncology

## 2020-04-30 ENCOUNTER — Encounter: Payer: Self-pay | Admitting: Hematology & Oncology

## 2020-04-30 ENCOUNTER — Inpatient Hospital Stay: Payer: Medicare Other | Attending: Hematology & Oncology

## 2020-04-30 ENCOUNTER — Other Ambulatory Visit: Payer: Self-pay

## 2020-04-30 VITALS — BP 103/71 | HR 92 | Temp 98.8°F | Resp 18 | Wt 187.2 lb

## 2020-04-30 DIAGNOSIS — R911 Solitary pulmonary nodule: Secondary | ICD-10-CM | POA: Diagnosis not present

## 2020-04-30 DIAGNOSIS — R918 Other nonspecific abnormal finding of lung field: Secondary | ICD-10-CM

## 2020-04-30 LAB — CBC WITH DIFFERENTIAL (CANCER CENTER ONLY)
Abs Immature Granulocytes: 0.02 10*3/uL (ref 0.00–0.07)
Basophils Absolute: 0 10*3/uL (ref 0.0–0.1)
Basophils Relative: 1 %
Eosinophils Absolute: 0.3 10*3/uL (ref 0.0–0.5)
Eosinophils Relative: 5 %
HCT: 41.1 % (ref 39.0–52.0)
Hemoglobin: 13.4 g/dL (ref 13.0–17.0)
Immature Granulocytes: 0 %
Lymphocytes Relative: 30 %
Lymphs Abs: 1.7 10*3/uL (ref 0.7–4.0)
MCH: 28.6 pg (ref 26.0–34.0)
MCHC: 32.6 g/dL (ref 30.0–36.0)
MCV: 87.6 fL (ref 80.0–100.0)
Monocytes Absolute: 0.7 10*3/uL (ref 0.1–1.0)
Monocytes Relative: 12 %
Neutro Abs: 3 10*3/uL (ref 1.7–7.7)
Neutrophils Relative %: 52 %
Platelet Count: 216 10*3/uL (ref 150–400)
RBC: 4.69 MIL/uL (ref 4.22–5.81)
RDW: 14.4 % (ref 11.5–15.5)
WBC Count: 5.8 10*3/uL (ref 4.0–10.5)
nRBC: 0 % (ref 0.0–0.2)

## 2020-04-30 LAB — CMP (CANCER CENTER ONLY)
ALT: 13 U/L (ref 0–44)
AST: 17 U/L (ref 15–41)
Albumin: 4.1 g/dL (ref 3.5–5.0)
Alkaline Phosphatase: 72 U/L (ref 38–126)
Anion gap: 6 (ref 5–15)
BUN: 21 mg/dL (ref 8–23)
CO2: 30 mmol/L (ref 22–32)
Calcium: 9.5 mg/dL (ref 8.9–10.3)
Chloride: 105 mmol/L (ref 98–111)
Creatinine: 0.82 mg/dL (ref 0.61–1.24)
GFR, Est AFR Am: >60 mL/min (ref >60)
GFR, Estimated: >60 mL/min (ref >60)
Glucose, Bld: 115 mg/dL — ABNORMAL HIGH (ref 70–99)
Potassium: 4.3 mmol/L (ref 3.5–5.1)
Sodium: 141 mmol/L (ref 135–145)
Total Bilirubin: 1 mg/dL (ref 0.3–1.2)
Total Protein: 6.6 g/dL (ref 6.5–8.1)

## 2020-04-30 NOTE — Progress Notes (Signed)
Hematology and Oncology Follow Up Visit  Andon Villard Wentzell [id number] [birth year] 69 y.o. 04/30/2020   Principle Diagnosis:   Right upper lung nodule  Current Therapy:    Observation     Interim History:  Mr. Slayden is back for follow-up.  He actually is go start his stereotactic radiosurgery next week.  He says he will have 3 treatments.  He feels well.  He has had no complaints.  There is no cough or shortness of breath.  He has had no increase issues with heart failure.  He does have the poor echocardiogram ejection fraction.  I know he is followed by cardiology.  I would have to think that his cardiac function is certainly doing well as he is quite active.  He had a very nice July 4 weekend.  He has had no fever.  He has had no change in bowel or bladder habits.  He has had no leg swelling.  Currently, I would have to say his performance status is probably ECOG 1.  Medications:  Current Outpatient Medications:  .  atorvastatin (LIPITOR) 20 MG tablet, Take 1 tablet (20 mg total) by mouth daily., Disp: 90 tablet, Rfl: 3 .  furosemide (LASIX) 20 MG tablet, Take 1 tablet (20 mg total) by mouth 2 (two) times daily. May also take 1 tablet (20 mg total) daily as needed for fluid (for 3 or more punds ovenoight weight gain)., Disp: 90 tablet, Rfl: 1 .  losartan (COZAAR) 25 MG tablet, Take 0.5 tablets (12.5 mg total) by mouth daily., Disp: 30 tablet, Rfl: 3 .  metoprolol succinate (TOPROL XL) 25 MG 24 hr tablet, Take 1 tablet (25 mg total) by mouth daily., Disp: 30 tablet, Rfl: 3 .  pantoprazole (PROTONIX) 40 MG tablet, Take 40 mg by mouth daily., Disp: , Rfl:  .  potassium chloride SA (KLOR-CON) 20 MEQ tablet, Take 1 tablet (20 mEq total) by mouth daily., Disp: 90 tablet, Rfl: 1  Allergies: No Known Allergies  Past Medical History, Surgical history, Social history, and Family History were reviewed and updated.  Review of Systems: Review of Systems  Constitutional: Negative.   HENT:   Negative.   Eyes: Negative.   Respiratory: Negative.   Cardiovascular: Negative.   Gastrointestinal: Negative.   Endocrine: Negative.   Genitourinary: Negative.    Musculoskeletal: Negative.   Skin: Negative.   Neurological: Negative.   Hematological: Negative.   Psychiatric/Behavioral: Negative.     Physical Exam:  weight is 187 lb 4 oz (84.9 kg). His oral temperature is 98.8 F (37.1 C). His blood pressure is 103/71 and his pulse is 92. His respiration is 18 and oxygen saturation is 98%.   Wt Readings from Last 3 Encounters:  04/30/20 187 lb 4 oz (84.9 kg)  04/21/20 185 lb 6.4 oz (84.1 kg)  04/14/20 185 lb 9.6 oz (84.2 kg)    Physical Exam Vitals reviewed.  HENT:     Head: Normocephalic and atraumatic.  Eyes:     Pupils: Pupils are equal, round, and reactive to light.  Cardiovascular:     Rate and Rhythm: Normal rate and regular rhythm.     Heart sounds: Normal heart sounds.  Pulmonary:     Effort: Pulmonary effort is normal.     Breath sounds: Normal breath sounds.  Abdominal:     General: Bowel sounds are normal.     Palpations: Abdomen is soft.  Musculoskeletal:        General: No tenderness or deformity. Normal range  of motion.     Cervical back: Normal range of motion.  Lymphadenopathy:     Cervical: No cervical adenopathy.  Skin:    General: Skin is warm and dry.     Findings: No erythema or rash.  Neurological:     Mental Status: He is alert and oriented to person, place, and time.  Psychiatric:        Behavior: Behavior normal.        Thought Content: Thought content normal.        Judgment: Judgment normal.      Lab Results  Component Value Date   WBC 5.8 04/30/2020   HGB 13.4 04/30/2020   HCT 41.1 04/30/2020   MCV 87.6 04/30/2020   PLT 216 04/30/2020     Chemistry      Component Value Date/Time   NA 141 04/30/2020 1500   NA 141 04/14/2020 1420   K 4.3 04/30/2020 1500   CL 105 04/30/2020 1500   CO2 30 04/30/2020 1500   BUN 21  04/30/2020 1500   BUN 22 04/14/2020 1420   CREATININE 0.82 04/30/2020 1500      Component Value Date/Time   CALCIUM 9.5 04/30/2020 1500   ALKPHOS 72 04/30/2020 1500   AST 17 04/30/2020 1500   ALT 13 04/30/2020 1500   BILITOT 1.0 04/30/2020 1500      Impression and Plan: Mr. Bartelt is a 69 year old white male.  He has a right upper lobe nodule.  I have to believe that this is malignant even though the biopsy showed atypical epithelial proliferation.  I very much appreciate Dr. Sofie Hartigan been able to do the stereotactic radiosurgery on this right upper lung nodule.  I think I would probably wait 2 months after he finishes his radiosurgery before we do a CT scan on him.  I will plan to see him back after Labor Day.  We will get a CT scan same to that we see him.   Volanda Napoleon, MD 7/7/20213:47 PM

## 2020-05-01 ENCOUNTER — Telehealth: Payer: Self-pay | Admitting: Hematology & Oncology

## 2020-05-01 ENCOUNTER — Other Ambulatory Visit (HOSPITAL_BASED_OUTPATIENT_CLINIC_OR_DEPARTMENT_OTHER): Payer: Medicare Other

## 2020-05-01 NOTE — Telephone Encounter (Signed)
Appointments scheduled calendar printed & mailed per 7/7 los

## 2020-05-05 ENCOUNTER — Ambulatory Visit: Payer: Medicare Other | Admitting: Radiation Oncology

## 2020-05-05 ENCOUNTER — Encounter: Payer: Self-pay | Admitting: *Deleted

## 2020-05-05 NOTE — Progress Notes (Signed)
Oncology Nurse Navigator Documentation  Oncology Nurse Navigator Flowsheets 05/05/2020  Abnormal Finding Date -  Diagnosis Status -  Planned Course of Treatment Radiation  Phase of Treatment Radiation  Radiation Actual Start Date: 05/07/2020  Radiation Expected End Date: 05/16/2020  Navigator Follow Up Date: 07/02/2020  Navigator Follow Up Reason: Follow-up Appointment  Navigator Location [facility]  Referral Date to RadOnc/MedOnc -  Navigator Encounter Type Appt/Treatment Plan Review  Telephone -  Patient Visit Type MedOnc  Treatment Phase Pre-Tx/Tx Discussion  Barriers/Navigation Needs No Barriers At This Time  Education -  Interventions None Required  Acuity Level 1-No Barriers  Referrals -  Coordination of Care -  Education Method -  Support Groups/Services Friends and Family  Time Spent with Patient 15

## 2020-05-07 ENCOUNTER — Ambulatory Visit: Payer: Medicare Other | Admitting: Radiation Oncology

## 2020-05-07 ENCOUNTER — Ambulatory Visit
Admission: RE | Admit: 2020-05-07 | Discharge: 2020-05-07 | Disposition: A | Discharge status: Home / Self Care | Payer: Medicare Other | Source: Ambulatory Visit | Attending: Radiation Oncology | Admitting: Radiation Oncology

## 2020-05-08 ENCOUNTER — Ambulatory Visit: Payer: Medicare Other | Admitting: Radiation Oncology

## 2020-05-09 ENCOUNTER — Ambulatory Visit: Payer: Medicare Other | Admitting: Radiation Oncology

## 2020-05-12 ENCOUNTER — Ambulatory Visit: Payer: Medicare Other | Admitting: Radiation Oncology

## 2020-05-13 ENCOUNTER — Ambulatory Visit: Payer: Medicare Other | Admitting: Radiation Oncology

## 2020-05-13 ENCOUNTER — Ambulatory Visit
Admission: RE | Admit: 2020-05-13 | Discharge: 2020-05-13 | Disposition: A | Discharge status: Home / Self Care | Payer: Medicare Other | Source: Ambulatory Visit | Attending: Radiation Oncology | Admitting: Radiation Oncology

## 2020-05-14 ENCOUNTER — Ambulatory Visit: Payer: Medicare Other | Admitting: Radiation Oncology

## 2020-05-15 ENCOUNTER — Ambulatory Visit: Payer: Medicare Other

## 2020-05-15 ENCOUNTER — Ambulatory Visit: Payer: Medicare Other | Admitting: Radiation Oncology

## 2020-05-15 ENCOUNTER — Ambulatory Visit
Admission: RE | Admit: 2020-05-15 | Discharge: 2020-05-15 | Disposition: A | Discharge status: Home / Self Care | Payer: Medicare Other | Source: Ambulatory Visit | Attending: Radiation Oncology | Admitting: Radiation Oncology

## 2020-05-15 ENCOUNTER — Telehealth: Payer: Self-pay

## 2020-05-15 NOTE — Telephone Encounter (Signed)
Called and lmomed the pt to reschedule for the med titration with the Clinical Pharmacist

## 2020-05-16 ENCOUNTER — Ambulatory Visit: Payer: Medicare Other | Admitting: Radiation Oncology

## 2020-05-19 ENCOUNTER — Ambulatory Visit
Admission: RE | Admit: 2020-05-19 | Discharge: 2020-05-19 | Disposition: A | Discharge status: Home / Self Care | Payer: Medicare Other | Source: Ambulatory Visit | Attending: Radiation Oncology | Admitting: Radiation Oncology

## 2020-05-19 ENCOUNTER — Ambulatory Visit: Payer: Medicare Other | Admitting: Radiation Oncology

## 2020-05-19 NOTE — Progress Notes (Signed)
°  Radiation Oncology         [phone] ________________________________  Name: Philip Campbell MRN: [id number]  Date: 05/21/2020  DOB: Dec 11, 1950  Stereotactic Body Radiotherapy Treatment Procedure Note  NARRATIVE:  Deni Lefever Viele was brought to the stereotactic radiation treatment machine and placed supine on the CT couch. The patient was set up for stereotactic body radiotherapy on the body fix pillow.  3D TREATMENT PLANNING AND DOSIMETRY:  The patient's radiation plan was reviewed and approved prior to starting treatment.  It showed 3-dimensional radiation distributions overlaid onto the planning CT.  [facility] for the target structures as well as the organs at risk were reviewed. The documentation of this is filed in the radiation oncology EMR.  SIMULATION VERIFICATION:  The patient underwent CT imaging on the treatment unit.  These were carefully aligned to document that the ablative radiation dose would cover the target volume and maximally spare the nearby organs at risk according to the planned distribution.  SPECIAL TREATMENT PROCEDURE: Darlina Rumpf Novakovich received high dose ablative stereotactic body radiotherapy to the planned target volume without unforeseen complications. Treatment was delivered uneventfully. The high doses associated with stereotactic body radiotherapy and the significant potential risks require careful treatment set up and patient monitoring constituting a special treatment procedure   STEREOTACTIC TREATMENT MANAGEMENT:  Following delivery, the patient was evaluated clinically. The patient tolerated treatment without significant acute effects, and was discharged to home in stable condition.    PLAN: Continue treatment as planned.  ________________________________  Blair Promise, PhD, MD  This document serves as a record of services personally performed by Gery Pray, MD. It was created on his behalf by Clerance Lav, a trained medical scribe. The creation  of this record is based on the scribe's personal observations and the provider's statements to them. This document has been checked and approved by the attending provider.

## 2020-05-21 ENCOUNTER — Other Ambulatory Visit: Payer: Self-pay

## 2020-05-21 ENCOUNTER — Ambulatory Visit
Admission: RE | Admit: 2020-05-21 | Discharge: 2020-05-21 | Disposition: A | Discharge status: Home / Self Care | Payer: Medicare Other | Source: Ambulatory Visit | Attending: Radiation Oncology | Admitting: Radiation Oncology

## 2020-05-21 DIAGNOSIS — R918 Other nonspecific abnormal finding of lung field: Secondary | ICD-10-CM

## 2020-05-21 DIAGNOSIS — R911 Solitary pulmonary nodule: Secondary | ICD-10-CM | POA: Diagnosis not present

## 2020-05-23 ENCOUNTER — Other Ambulatory Visit: Payer: Self-pay

## 2020-05-23 ENCOUNTER — Ambulatory Visit
Admission: RE | Admit: 2020-05-23 | Discharge: 2020-05-23 | Disposition: A | Discharge status: Home / Self Care | Payer: Medicare Other | Source: Ambulatory Visit | Attending: Radiation Oncology | Admitting: Radiation Oncology

## 2020-05-23 DIAGNOSIS — R918 Other nonspecific abnormal finding of lung field: Secondary | ICD-10-CM

## 2020-05-23 DIAGNOSIS — R911 Solitary pulmonary nodule: Secondary | ICD-10-CM | POA: Diagnosis not present

## 2020-05-26 ENCOUNTER — Other Ambulatory Visit: Payer: Self-pay

## 2020-05-26 ENCOUNTER — Telehealth: Payer: Self-pay | Admitting: Cardiology

## 2020-05-26 ENCOUNTER — Ambulatory Visit: Payer: Medicare Other | Admitting: Radiation Oncology

## 2020-05-26 ENCOUNTER — Ambulatory Visit
Admission: RE | Admit: 2020-05-26 | Discharge: 2020-05-26 | Disposition: A | Discharge status: Home / Self Care | Payer: Medicare Other | Source: Ambulatory Visit | Attending: Radiation Oncology | Admitting: Radiation Oncology

## 2020-05-26 DIAGNOSIS — R911 Solitary pulmonary nodule: Secondary | ICD-10-CM | POA: Insufficient documentation

## 2020-05-26 NOTE — Telephone Encounter (Signed)
Left message for patient to call and discuss scheduling Cardiac MRI ordered by Dr. Gardiner Rhyme

## 2020-05-27 ENCOUNTER — Ambulatory Visit: Payer: Medicare Other | Admitting: Radiation Oncology

## 2020-05-28 ENCOUNTER — Ambulatory Visit
Admission: RE | Admit: 2020-05-28 | Discharge: 2020-05-28 | Disposition: A | Discharge status: Home / Self Care | Payer: Medicare Other | Source: Ambulatory Visit | Attending: Radiation Oncology | Admitting: Radiation Oncology

## 2020-05-28 ENCOUNTER — Other Ambulatory Visit: Payer: Self-pay

## 2020-05-28 ENCOUNTER — Ambulatory Visit: Payer: Medicare Other

## 2020-05-28 DIAGNOSIS — R911 Solitary pulmonary nodule: Secondary | ICD-10-CM | POA: Diagnosis not present

## 2020-05-28 NOTE — Progress Notes (Deleted)
Patient ID: Philip Campbell                 DOB: [birth year]                      MRN: [id number]     HPI: Philip Campbell is a 69 y.o. male referred by Dr. Gardiner Rhyme to HTN clinic. PMH includes HFrEF 20% on September 2020, lung mass, and severe mitral regurgitation. Patient was unable to tolerate ACEI, ARB or ARNI in the past d/t hypotension. Dr Gardiner Rhyme added low dose losartan to carvedilol therapy and plan to add low dose spironolactone if patient able to tolerate.   During last OV with pharmacist I decreased his losartan to 12.5mg  daily and furosemide to 20mg  twice daily.  Noted potassium was at 5.0 and not appropriate to initiate spironolactone at the time.  Patient presents to clinic today and denies dizziness, increased fatigue, swelling or lightheadedness.   Current HTN meds:  Carvedilol 3.125mg  twice daily Losartan 25mg  daily Furosemide 20mg  twice daily  Previously tried:   BP goal: <130/80  Family History: The patient'sfamily history includes Alcoholism in his mother; CAD in his mother. There is no history of Heart failure.  Social History: denies hx of alcohol or tobacco use  Diet: balanced, decreased sodium  Exercise: walks his dog 3/x day  Home BP readings: none available systolic ~35T per patient recollection  Wt Readings from Last 3 Encounters:  04/30/20 187 lb 4 oz (84.9 kg)  04/21/20 185 lb 6.4 oz (84.1 kg)  04/14/20 185 lb 9.6 oz (84.2 kg)   BP Readings from Last 3 Encounters:  04/30/20 103/71  04/21/20 101/67  04/14/20 130/60   Pulse Readings from Last 3 Encounters:  04/30/20 92  04/21/20 71  04/14/20 68    Renal function: CrCl cannot be calculated (Patient's most recent lab result is older than the maximum 21 days allowed.).  Past Medical History:  Diagnosis Date   Chronic systolic CHF (congestive heart failure) (HCC)    Goals of care, counseling/discussion 04/23/2020    Current Outpatient Medications on File Prior to Visit  Medication Sig  Dispense Refill   atorvastatin (LIPITOR) 20 MG tablet Take 1 tablet (20 mg total) by mouth daily. 90 tablet 3   furosemide (LASIX) 20 MG tablet Take 1 tablet (20 mg total) by mouth 2 (two) times daily. May also take 1 tablet (20 mg total) daily as needed for fluid (for 3 or more punds ovenoight weight gain). 90 tablet 1   losartan (COZAAR) 25 MG tablet Take 0.5 tablets (12.5 mg total) by mouth daily. 30 tablet 3   metoprolol succinate (TOPROL XL) 25 MG 24 hr tablet Take 1 tablet (25 mg total) by mouth daily. 30 tablet 3   pantoprazole (PROTONIX) 40 MG tablet Take 40 mg by mouth daily.     potassium chloride SA (KLOR-CON) 20 MEQ tablet Take 1 tablet (20 mEq total) by mouth daily. 90 tablet 1   No current facility-administered medications on file prior to visit.    No Known Allergies  There were no vitals taken for this visit.  No problem-specific Assessment & Plan notes found for this encounter.    Adelma Bowdoin Rodriguez-Guzman PharmD, BCPS, [facility] [place] [address] 05/28/2020 7:55 AM

## 2020-05-29 ENCOUNTER — Ambulatory Visit: Payer: Medicare Other | Admitting: Radiation Oncology

## 2020-05-29 NOTE — Telephone Encounter (Signed)
Left message for patient to call and discuss scheduling the Cardiac MRI ordered by Dr. Gardiner Rhyme

## 2020-05-30 ENCOUNTER — Ambulatory Visit
Admission: RE | Admit: 2020-05-30 | Discharge: 2020-05-30 | Disposition: A | Discharge status: Home / Self Care | Payer: Medicare Other | Source: Ambulatory Visit | Attending: Radiation Oncology | Admitting: Radiation Oncology

## 2020-05-30 ENCOUNTER — Encounter: Payer: Self-pay | Admitting: Radiation Oncology

## 2020-05-30 DIAGNOSIS — R911 Solitary pulmonary nodule: Secondary | ICD-10-CM | POA: Diagnosis not present

## 2020-05-30 DIAGNOSIS — C3411 Malignant neoplasm of upper lobe, right bronchus or lung: Secondary | ICD-10-CM | POA: Diagnosis not present

## 2020-06-02 ENCOUNTER — Telehealth: Payer: Self-pay | Admitting: *Deleted

## 2020-06-02 ENCOUNTER — Other Ambulatory Visit: Payer: Self-pay

## 2020-06-02 DIAGNOSIS — R590 Localized enlarged lymph nodes: Secondary | ICD-10-CM

## 2020-06-02 NOTE — Progress Notes (Signed)
CT

## 2020-06-02 NOTE — Telephone Encounter (Signed)
Called patient to inform of CT for 06-05-20 - arrival time- 1:45 pm @ [facility], patient to have water only - 4 hrs. prior to test, lvm for a return call

## 2020-06-02 NOTE — Progress Notes (Incomplete)
  Patient Name: Philip Campbell MRN: [id number] DOB: 02-May-1951 Referring Physician: Abelino Derrick Date of Service: 05/30/2020 [facility], [place]                                                        End Of Treatment Note  Diagnoses: R91.8-Other nonspecific abnormal finding of lung field right upper lobe  Cancer Staging: Right upper lung nodule, presumed non-small cell lung cancer  Intent: Curative  Radiation Treatment Dates: 05/21/2020 through 05/30/2020 Site Technique Total Dose (Gy) Dose per Fx (Gy) Completed Fx Beam Energies  Lung, Right: Lung_Rt IMRT 60/60 12 5/5 6XFFF   Narrative: The patient tolerated radiation therapy relatively well. He did report moderate fatigue and a swollen/tender area under his right jaw. He denied shortness of breath, chest pain, cough, difficulty swallowing, skin changes, and poor appetite. On final examination, the patient was noted to have a palpable approximately 2 cm mass at the angle of the right mandible that was suspicious for a lymph node. No oropharyngeal lesions were seen.   Plan: The patient will follow-up with radiation oncology in one month. He is scheduled to undergo a soft tissue neck CT scan on 06/05/2020 to evaluate the right neck mass. He will also undergo a chest CT scan just prior to his follow-up visit.  ________________________________________________   Blair Promise, PhD, MD  This document serves as a record of services personally performed by Gery Pray, MD. It was created on his behalf by Clerance Lav, a trained medical scribe. The creation of this record is based on the scribe's personal observations and the provider's statements to them. This document has been checked and approved by the attending provider.

## 2020-06-02 NOTE — Telephone Encounter (Signed)
Called patient to inform of CT for 06-05-20 - arrival time- 1:45 pm @ [facility], patient to have water only 4 hrs. prior to test, line busy will call later

## 2020-06-05 ENCOUNTER — Ambulatory Visit (HOSPITAL_COMMUNITY)
Admission: RE | Admit: 2020-06-05 | Discharge: 2020-06-05 | Disposition: A | Discharge status: Home / Self Care | Payer: Medicare Other | Source: Ambulatory Visit | Attending: Radiation Oncology | Admitting: Radiation Oncology

## 2020-06-05 ENCOUNTER — Other Ambulatory Visit: Payer: Self-pay

## 2020-06-05 DIAGNOSIS — R590 Localized enlarged lymph nodes: Secondary | ICD-10-CM

## 2020-06-06 ENCOUNTER — Telehealth: Payer: Self-pay | Admitting: *Deleted

## 2020-06-06 NOTE — Telephone Encounter (Signed)
CALLED PATIENT TO ASK WHEN HE WOULD BE WILLING TO DO A SCAN, LVM FOR A RETURN CALL

## 2020-06-06 NOTE — Telephone Encounter (Signed)
CALLED PATIENT TO ASK ABOUT RESCHEDULING MISSED SCAN, LINE BUSY WILL CALL LATER

## 2020-06-06 NOTE — Telephone Encounter (Signed)
CALLED PATIENT TO ASK ABOUT RESCHEDULING MISSED CT, LINE BUSY WILL CALL LATER

## 2020-06-09 NOTE — Telephone Encounter (Signed)
Left message for patient to call and discuss rescheduling Cardiac MRI ordered by Dr. Gardiner Rhyme

## 2020-06-25 NOTE — Progress Notes (Signed)
Cardiology Office Note:    Date:  06/26/2020   ID:  Philip Campbell, DOB 04/08/1951, MRN [id number]  PCP:  Philip Maw, MD  Cardiologist:  No primary care provider on file.  Electrophysiologist:  None   Referring MD: Philip Campbell,*   Chief Complaint  Patient presents with  . Congestive Heart Failure    History of Present Illness:    Philip Campbell is a 69 y.o. male with a hx of chronic systolic heart failure (EF 20%), lung mass who presents for follow-up.  He was referred by Dr. Ethelene Campbell for evaluation of heart failure, initially seen on 03/10/2020.  He was admitted to [facility] in [place] in September 2020 with shortness of breath.  Found to have EF 20%.  Cardiac catheterization showed nonobstructive disease.  Also found to have severe mitral regurgitation.  He was diuresed and started on Lasix, ACE inhibitor, and beta-blocker.  He had to stop the ACE inhibitor and beta-blocker due to low BPs.  He presented to [facility] ED on 01/23/2020 with worsening shortness of breath.  He was diuresed and started on Coreg.  ACE/ARB/Arni was not started due to low BP.  Also found to have lung mass, was referred to Dr. Marin Campbell, treating with radiation.  Since last clinic visit, he reports that he has been doing well.  He completed radiation for his lung mass.  Has been taking Lasix 40 mg daily.  States that weight has been stable between 182 to 185 pounds.  Reports BP has improved, occasionally down to 100s over 50s but states that on average is 120s to 130s over 80s to 90s.  He denies any lightheadedness or syncope.  Denies any palpitations.   Wt Readings from Last 3 Encounters:  06/26/20 182 lb 12.8 oz (82.9 kg)  04/30/20 187 lb 4 oz (84.9 kg)  04/21/20 185 lb 6.4 oz (84.1 kg)       Past Medical History:  Diagnosis Date  . Chronic systolic CHF (congestive heart failure) ([place])   . Goals of care, counseling/discussion 04/23/2020    Past Surgical History:  Procedure  Laterality Date  . ELBOW BURSA SURGERY    . GALLBLADDER SURGERY    . HERNIA REPAIR      Current Medications: Current Meds  Medication Sig  . atorvastatin (LIPITOR) 20 MG tablet Take 1 tablet (20 mg total) by mouth daily.  . furosemide (LASIX) 20 MG tablet Take 20 mg by mouth as needed. In the evening  . furosemide (LASIX) 40 MG tablet Take 40 mg by mouth daily.   . metoprolol succinate (TOPROL XL) 25 MG 24 hr tablet Take 1 tablet (25 mg total) by mouth daily.  . pantoprazole (PROTONIX) 40 MG tablet Take 40 mg by mouth daily.  . potassium chloride SA (KLOR-CON) 20 MEQ tablet Take 1 tablet (20 mEq total) by mouth daily.  . [DISCONTINUED] carvedilol (COREG) 3.125 MG tablet Take 3.125 mg by mouth 2 (two) times daily.  . [DISCONTINUED] furosemide (LASIX) 20 MG tablet Take 1 tablet (20 mg total) by mouth 2 (two) times daily. May also take 1 tablet (20 mg total) daily as needed for fluid (for 3 or more punds ovenoight weight gain).     Allergies:   Patient has no known allergies.   Social History   Socioeconomic History  . Marital status: Single    Spouse name: Not on file  . Number of children: Not on file  . Years of education: Not on file  .  Highest education level: Not on file  Occupational History  . Not on file  Tobacco Use  . Smoking status: Never Smoker  . Smokeless tobacco: Never Used  Vaping Use  . Vaping Use: Never used  Substance and Sexual Activity  . Alcohol use: Never  . Drug use: Never  . Sexual activity: Not on file  Other Topics Concern  . Not on file  Social History Narrative  . Not on file   Social Determinants of Health   Financial Resource Strain:   . Difficulty of Paying Living Expenses: Not on file  Food Insecurity:   . Worried About Charity fundraiser in the Last Year: Not on file  . Ran Out of Food in the Last Year: Not on file  Transportation Needs:   . Lack of Transportation (Medical): Not on file  . Lack of Transportation (Non-Medical): Not  on file  Physical Activity:   . Days of Exercise per Week: Not on file  . Minutes of Exercise per Session: Not on file  Stress:   . Feeling of Stress : Not on file  Social Connections:   . Frequency of Communication with Friends and Family: Not on file  . Frequency of Social Gatherings with Friends and Family: Not on file  . Attends Religious Services: Not on file  . Active Member of Clubs or Organizations: Not on file  . Attends Archivist Meetings: Not on file  . Marital Status: Not on file     Family History: The patient'sfamily history includes Alcoholism in his mother; CAD in his mother. There is no history of Heart failure.  ROS:   Please see the history of present illness.    All other systems reviewed and are negative.  EKGs/Labs/Other Studies Reviewed:    The following studies were reviewed today:   EKG:  EKG is ordered today.  The ekg ordered today demonstrates normal sinus rhythm, rate 98, LVH with repolarization abnormalities  TTE 01/24/20: 1. Severe global reduction in LV systolic function; severe LVE;  restrictive filling; mildly dilated aortic root (4.3 cm); mild AI:  moderate to severe MR; biatrial enlargement; moderate RV dysfunction; mild  TR.  2. Left ventricular ejection fraction, by estimation, is <20%. The left  ventricle has severely decreased function. The left ventricle demonstrates  global hypokinesis. The left ventricular internal cavity size was severely  dilated. Left ventricular  diastolic parameters are consistent with Grade III diastolic dysfunction  (restrictive). Elevated left atrial pressure.  3. Right ventricular systolic function is moderately reduced. The right  ventricular size is normal. There is mildly elevated pulmonary artery  systolic pressure.  4. Left atrial size was severely dilated.  5. Right atrial size was moderately dilated.  6. The mitral valve is normal in structure. Moderate to severe mitral  valve  regurgitation. No evidence of mitral stenosis.  7. The aortic valve is tricuspid. Aortic valve regurgitation is mild.  Mild aortic valve sclerosis is present, with no evidence of aortic valve  stenosis.  8. Aortic dilatation noted. There is mild dilatation of the aortic root  measuring 43 mm.  9. The inferior vena cava is normal in size with greater than 50%   Recent Labs: 01/23/2020: B Natriuretic Peptide 1,845.6 04/14/2020: Magnesium 1.8 04/30/2020: ALT 13; BUN 21; Creatinine 0.82; Hemoglobin 13.4; Platelet Count 216; Potassium 4.3; Sodium 141  Recent Lipid Panel    Component Value Date/Time   CHOL 181 02/21/2020 1425   TRIG 166.0 (H) 02/21/2020 1425  HDL 46.70 02/21/2020 1425   CHOLHDL 4 02/21/2020 1425   VLDL 33.2 02/21/2020 1425   LDLCALC 101 (H) 02/21/2020 1425   LDLDIRECT 116.0 02/21/2020 1425    Physical Exam:    VS:  BP 112/62   Pulse (!) 105   Ht 5\' 11"  (1.803 m)   Wt 182 lb 12.8 oz (82.9 kg)   SpO2 95%   BMI 25.50 kg/m     Wt Readings from Last 3 Encounters:  06/26/20 182 lb 12.8 oz (82.9 kg)  04/30/20 187 lb 4 oz (84.9 kg)  04/21/20 185 lb 6.4 oz (84.1 kg)     GEN: in no acute distress HEENT: Normal NECK: No JVD; No carotid bruits CARDIAC: RRR, 2 out of 6 systolic murmur RESPIRATORY:  Clear to auscultation without rales, wheezing or rhonchi  ABDOMEN: Soft, non-tender, non-distended MUSCULOSKELETAL:  No edema SKIN: Warm and dry NEUROLOGIC:  Alert and oriented x 3 PSYCHIATRIC:  Normal affect   ASSESSMENT:    1. Chronic combined systolic and diastolic heart failure ([place])   2. Mitral valve insufficiency, unspecified etiology   3. Aortic root dilatation ([facility])    PLAN:    Chronic combined systolic and diastolic heart failure: EF less than 20% on TTE 01/24/2020.  Grade 3 diastolic dysfunction.  Cardiac catheterization in [place] on 08/07/2019 showed mild nonobstructive CAD.  GDMT has been limited by low blood pressure -Recommended cardiac MRI to  further evaluate etiology of cardiomyopathy, however patient reports he is unable to afford.  Have asked social worker to reach out to him about available resources -Continue Lasix 40 mg.  Appears euvolemic.  Will check BMP/magnesium -Continue Toprol-XL 25 mg.  He reports he is also taking carvedilol 3.125 mg twice daily.  Discussed that he should discontinue the carvedilol and continue Toprol-XL alone. -On losartan 12.5 mg daily.  If stable electrolytes/renal function, will increase to 25 mg daily -Follow-up in pharmacy clinic in 2 weeks for further medication titration.  Can consider adding spironolactone if stable BP and renal function/electrolytes  Mitral regurgitation: Moderate to severe on TTE 01/24/2020.  Appears functional.  Will quantify degree of regurgitation on CMR as above.  Aortic root dilatation: Measures 43 mm.  Can evaluate aorta on MRI as above  Lung mass: Follows with oncology, started on radiation  RTC in 3 months  Medication Adjustments/Labs and Tests Ordered: Current medicines are reviewed at length with the patient today.  Concerns regarding medicines are outlined above.  Orders Placed This Encounter  Procedures  . Basic metabolic panel  . Magnesium  . EKG 12-Lead   No orders of the defined types were placed in this encounter.   Patient Instructions  Medication Instructions:  STOP carvedilol --continue metoprolol (Toprol XL)  *If you need a refill on your cardiac medications before your next appointment, please call your pharmacy*   Lab Work: BMET, Mag today  If you have labs (blood work) drawn today and your tests are completely normal, you will receive your results only by: Marland Kitchen MyChart Message (if you have MyChart) OR . A paper copy in the mail If you have any lab test that is abnormal or we need to change your treatment, we will call you to review the results.  Follow-Up: At [facility], you and your health needs are our priority.  As part of our  continuing mission to provide you with exceptional heart care, we have created designated Provider Care Teams.  These Care Teams include your primary Cardiologist (physician) and Advanced  Practice Providers (APPs -  Physician Assistants and Nurse Practitioners) who all work together to provide you with the care you need, when you need it.  We recommend signing up for the patient portal called "MyChart".  Sign up information is provided on this After Visit Summary.  MyChart is used to connect with patients for Virtual Visits (Telemedicine).  Patients are able to view lab/test results, encounter notes, upcoming appointments, etc.  Non-urgent messages can be sent to your provider as well.   To learn more about what you can do with MyChart, go to [email or web address].    Your next appointment:   2 weeks with pharmacist (med rec) 3 months with Dr. Gardiner Rhyme    Signed, Donato Heinz, MD  06/26/2020 5:26 PM    [place]

## 2020-06-26 ENCOUNTER — Other Ambulatory Visit: Payer: Self-pay

## 2020-06-26 ENCOUNTER — Encounter: Payer: Self-pay | Admitting: Cardiology

## 2020-06-26 ENCOUNTER — Ambulatory Visit (INDEPENDENT_AMBULATORY_CARE_PROVIDER_SITE_OTHER): Payer: Medicare Other | Admitting: Cardiology

## 2020-06-26 VITALS — BP 112/62 | HR 105 | Ht 71.0 in | Wt 182.8 lb

## 2020-06-26 DIAGNOSIS — I5042 Chronic combined systolic (congestive) and diastolic (congestive) heart failure: Secondary | ICD-10-CM

## 2020-06-26 DIAGNOSIS — I340 Nonrheumatic mitral (valve) insufficiency: Secondary | ICD-10-CM | POA: Diagnosis not present

## 2020-06-26 DIAGNOSIS — I77810 Thoracic aortic ectasia: Secondary | ICD-10-CM

## 2020-06-26 NOTE — Patient Instructions (Signed)
Medication Instructions:  STOP carvedilol --continue metoprolol (Toprol XL)  *If you need a refill on your cardiac medications before your next appointment, please call your pharmacy*   Lab Work: BMET, Mag today  If you have labs (blood work) drawn today and your tests are completely normal, you will receive your results only by:  [place] (if you have MyChart) OR  A paper copy in the mail If you have any lab test that is abnormal or we need to change your treatment, we will call you to review the results.  Follow-Up: At [facility], you and your health needs are our priority.  As part of our continuing mission to provide you with exceptional heart care, we have created designated Provider Care Teams.  These Care Teams include your primary Cardiologist (physician) and Advanced Practice Providers (APPs -  Physician Assistants and Nurse Practitioners) who all work together to provide you with the care you need, when you need it.  We recommend signing up for the patient portal called "MyChart".  Sign up information is provided on this After Visit Summary.  MyChart is used to connect with patients for Virtual Visits (Telemedicine).  Patients are able to view lab/test results, encounter notes, upcoming appointments, etc.  Non-urgent messages can be sent to your provider as well.   To learn more about what you can do with MyChart, go to [email or web address].    Your next appointment:   2 weeks with pharmacist (med rec) 3 months with Dr. Gardiner Rhyme

## 2020-06-27 ENCOUNTER — Telehealth: Payer: Self-pay | Admitting: Cardiology

## 2020-06-27 DIAGNOSIS — I5042 Chronic combined systolic (congestive) and diastolic (congestive) heart failure: Secondary | ICD-10-CM

## 2020-06-27 DIAGNOSIS — Z79899 Other long term (current) drug therapy: Secondary | ICD-10-CM

## 2020-06-27 LAB — BASIC METABOLIC PANEL
BUN/Creatinine Ratio: 17 (ref 10–24)
BUN: 27 mg/dL (ref 8–27)
CO2: 24 mmol/L (ref 20–29)
Calcium: 9.1 mg/dL (ref 8.6–10.2)
Chloride: 101 mmol/L (ref 96–106)
Creatinine, Ser: 1.62 mg/dL — ABNORMAL HIGH (ref 0.76–1.27)
GFR calc Af Amer: 49 mL/min/{1.73_m2} — ABNORMAL LOW (ref >59)
GFR calc non Af Amer: 42 mL/min/{1.73_m2} — ABNORMAL LOW (ref >59)
Glucose: 120 mg/dL — ABNORMAL HIGH (ref 65–99)
Potassium: 3.5 mmol/L (ref 3.5–5.2)
Sodium: 141 mmol/L (ref 134–144)

## 2020-06-27 LAB — MAGNESIUM: Magnesium: 1.6 mg/dL (ref 1.6–2.3)

## 2020-06-27 NOTE — Telephone Encounter (Signed)
Patient is calling back to confirm that he received message regarding las results and Dr. Newman Nickels recommendations.

## 2020-06-27 NOTE — Telephone Encounter (Signed)
Noted-order placed for repeat in 1 week

## 2020-07-02 ENCOUNTER — Inpatient Hospital Stay: Payer: Medicare Other | Attending: Hematology & Oncology

## 2020-07-02 ENCOUNTER — Inpatient Hospital Stay: Payer: Medicare Other | Admitting: Hematology & Oncology

## 2020-07-02 ENCOUNTER — Ambulatory Visit (HOSPITAL_BASED_OUTPATIENT_CLINIC_OR_DEPARTMENT_OTHER): Admission: RE | Admit: 2020-07-02 | Payer: Medicare Other | Source: Ambulatory Visit

## 2020-07-03 ENCOUNTER — Encounter: Payer: Self-pay | Admitting: *Deleted

## 2020-07-03 ENCOUNTER — Ambulatory Visit
Admission: RE | Admit: 2020-07-03 | Discharge: 2020-07-03 | Disposition: A | Discharge status: Home / Self Care | Payer: Medicare Other | Source: Ambulatory Visit | Attending: Radiation Oncology | Admitting: Radiation Oncology

## 2020-07-03 NOTE — Progress Notes (Signed)
Patient was a no-show to yesterdays CT scan and follow up appointment with Dr Marin Olp.  Called and spoke to patient. He states "I have too much going on right now and I need to put this off for now". Explained to him that not coming to his follow up appointments and having his scans made it impossible to assess treatment response, and ensure that his disease wasn't progressing. He states he understands this risk but can't make any additional commitments at this time. Tried to assess what was overwhelming him at this time but patient wasn't too forthcoming. He did at one time say "I have a few court things coming up with dates close together and I don't know how that will go"  I asked the patient when I could call him back to reschedule him for follow up and he requested that I not call him. He stated that he would call when he was ready to come back.  Notified Dr Marin Olp.  Oncology Nurse Navigator Documentation  Oncology Nurse Navigator Flowsheets 07/03/2020  Abnormal Finding Date -  Diagnosis Status -  Planned Course of Treatment -  Phase of Treatment -  Radiation Actual Start Date: -  Radiation Expected End Date: -  Navigator Follow Up Date: -  Navigator Follow Up Reason: -  Navigator Location [facility]  Referral Date to RadOnc/MedOnc -  Navigator Encounter Type Telephone  Telephone Outgoing Call;Asess Navigation Needs;Appt Confirmation/Clarification  Patient Visit Type MedOnc  Treatment Phase Post-Tx Follow-up  Barriers/Navigation Needs Coordination of Care;Education;Personal Conflicts  Education Other  Interventions Coordination of Care;Education;Psycho-Social Support  Acuity Level 3-Moderate Needs (3-4 Barriers Identified)  Referrals -  Coordination of Care -  Education Method Verbal  Support Groups/Services -  Time Spent with Patient 30

## 2020-07-23 ENCOUNTER — Telehealth: Payer: Self-pay | Admitting: Cardiology

## 2020-07-23 NOTE — Telephone Encounter (Signed)
Left message for patient to call and discuss scheduling Cardiac MRI ordered by Dr. Gardiner Rhyme

## 2020-07-31 ENCOUNTER — Encounter: Payer: Self-pay | Admitting: Cardiology

## 2020-07-31 NOTE — Telephone Encounter (Signed)
Patient has not returned calls to discuss rescheduling the Cardiac MRI ordered by Dr. Gardiner Rhyme.  Letter mailed requesting patient to call

## 2020-08-05 ENCOUNTER — Other Ambulatory Visit: Payer: Self-pay | Admitting: Family Medicine

## 2020-08-05 MED ORDER — PANTOPRAZOLE SODIUM 40 MG PO TBEC: 40.0000 mg | DELAYED_RELEASE_TABLET | Freq: Every day | ORAL | 0 refills | Status: DC

## 2020-08-05 NOTE — Telephone Encounter (Signed)
Refill request for pending medication. Last OV April 2021 called patient to schedule no answer LMTCB to schedule follow up appointment. Please advise.

## 2020-08-05 NOTE — Telephone Encounter (Signed)
Patient is calling and requested a refill for Protonix sent to [facility] on [address], please advise. CB is [phone]

## 2020-08-26 ENCOUNTER — Telehealth (HOSPITAL_COMMUNITY): Payer: Self-pay | Admitting: Vascular Surgery

## 2020-08-26 ENCOUNTER — Other Ambulatory Visit: Payer: Self-pay

## 2020-08-26 NOTE — Telephone Encounter (Signed)
Called pt to make new chf appt, phone # is not working #

## 2020-08-27 ENCOUNTER — Ambulatory Visit (INDEPENDENT_AMBULATORY_CARE_PROVIDER_SITE_OTHER): Payer: Medicare Other | Admitting: Nurse Practitioner

## 2020-08-27 ENCOUNTER — Ambulatory Visit (HOSPITAL_COMMUNITY)
Admission: RE | Admit: 2020-08-27 | Discharge: 2020-08-27 | Disposition: A | Discharge status: Home / Self Care | Payer: Medicare Other | Source: Ambulatory Visit | Attending: Nurse Practitioner | Admitting: Nurse Practitioner

## 2020-08-27 ENCOUNTER — Encounter: Payer: Self-pay | Admitting: Nurse Practitioner

## 2020-08-27 ENCOUNTER — Other Ambulatory Visit: Payer: Self-pay | Admitting: Family Medicine

## 2020-08-27 ENCOUNTER — Ambulatory Visit: Payer: Medicare Other

## 2020-08-27 VITALS — BP 120/80 | HR 86 | Temp 96.5°F | Ht 70.0 in | Wt 177.3 lb

## 2020-08-27 DIAGNOSIS — S6991XA Unspecified injury of right wrist, hand and finger(s), initial encounter: Secondary | ICD-10-CM | POA: Diagnosis not present

## 2020-08-27 DIAGNOSIS — S62616A Displaced fracture of proximal phalanx of right little finger, initial encounter for closed fracture: Secondary | ICD-10-CM | POA: Diagnosis not present

## 2020-08-27 DIAGNOSIS — S62626A Displaced fracture of medial phalanx of right little finger, initial encounter for closed fracture: Secondary | ICD-10-CM | POA: Diagnosis not present

## 2020-08-27 DIAGNOSIS — I5022 Chronic systolic (congestive) heart failure: Secondary | ICD-10-CM

## 2020-08-27 DIAGNOSIS — R600 Localized edema: Secondary | ICD-10-CM | POA: Diagnosis not present

## 2020-08-27 NOTE — Telephone Encounter (Signed)
lft VM to rtn call. Dm/cma  

## 2020-08-27 NOTE — Patient Instructions (Signed)
You will be contacted with x-ray results
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
## 2020-08-28 ENCOUNTER — Encounter: Payer: Self-pay | Admitting: Nurse Practitioner

## 2020-09-01 ENCOUNTER — Ambulatory Visit: Payer: Medicare Other | Admitting: Physician Assistant

## 2020-09-02 ENCOUNTER — Telehealth: Payer: Self-pay | Admitting: Family Medicine

## 2020-09-02 NOTE — Telephone Encounter (Signed)
Patient number not in service 

## 2020-09-24 ENCOUNTER — Other Ambulatory Visit: Payer: Self-pay | Admitting: Cardiology

## 2020-09-24 NOTE — Telephone Encounter (Signed)
Called patient to schedule follow up appointment no answer LM to call back and schedule.

## 2020-10-01 ENCOUNTER — Ambulatory Visit (INDEPENDENT_AMBULATORY_CARE_PROVIDER_SITE_OTHER): Payer: Medicare Other | Admitting: Nurse Practitioner

## 2020-10-01 ENCOUNTER — Encounter: Payer: Self-pay | Admitting: Nurse Practitioner

## 2020-10-01 ENCOUNTER — Other Ambulatory Visit: Payer: Self-pay

## 2020-10-01 VITALS — BP 110/64 | HR 87 | Temp 96.6°F | Ht 71.0 in | Wt 171.0 lb

## 2020-10-01 DIAGNOSIS — K219 Gastro-esophageal reflux disease without esophagitis: Secondary | ICD-10-CM

## 2020-10-01 DIAGNOSIS — R002 Palpitations: Secondary | ICD-10-CM

## 2020-10-01 DIAGNOSIS — I5022 Chronic systolic (congestive) heart failure: Secondary | ICD-10-CM

## 2020-10-01 DIAGNOSIS — I2510 Atherosclerotic heart disease of native coronary artery without angina pectoris: Secondary | ICD-10-CM | POA: Diagnosis not present

## 2020-10-01 DIAGNOSIS — R0609 Other forms of dyspnea: Secondary | ICD-10-CM

## 2020-10-01 DIAGNOSIS — R0600 Dyspnea, unspecified: Secondary | ICD-10-CM

## 2020-10-01 MED ORDER — ATORVASTATIN CALCIUM 20 MG PO TABS: 20.0000 mg | ORAL_TABLET | Freq: Every day | ORAL | 3 refills | Status: DC

## 2020-10-01 MED ORDER — METOPROLOL SUCCINATE ER 25 MG PO TB24: 25.0000 mg | ORAL_TABLET | Freq: Every day | ORAL | 3 refills | Status: AC

## 2020-10-01 NOTE — Patient Instructions (Addendum)
Resume metoprolol and atorvastatin. Go to lab for blood draw.  Schedule f/up with CHF clinic ([phone]) and oncology radiation ([phone]).

## 2020-10-01 NOTE — Progress Notes (Unsigned)
Subjective:  Patient ID: Philip Campbell, male    DOB: [birth year]  Age: 69 y.o. MRN: [id number]  CC: Medication Problem (pt is here to discuss medications, how to properly, and which ones to take/stop, pt c/o of increased heart rate)  Congestive Heart Failure Presents for follow-up visit. Associated symptoms include fatigue and palpitations. Pertinent negatives include no abdominal pain, chest pain, chest pressure, claudication, edema, muscle weakness, near-syncope, nocturia, orthopnea, paroxysmal nocturnal dyspnea, shortness of breath or unexpected weight change. The symptoms have been stable. Compliance with total regimen is 0-25%. Compliance problems: unsure about when to resolve medication.  Compliance with diet is 76-100%. Compliance with medications is 0-25%.  furosemide, potassium and losartan was put on hold by cardiology due to decline in renal function. He was to return for repeat BMP but did not. Did not f/up with oncology for repeat CT chest after radiation in 05/2020. Reports resting HR of 100-120 at home (Asymptomatic, feels regular)  no cough, no nausea, no constipation, no dysuria, no fever, no melena or hematochezia. No change in appetite.  Stopped caffeine 65months ago due to GERD symptoms No ETOH use No tobacco use  BP Readings from Last 3 Encounters:  10/01/20 110/64  08/27/20 120/80  06/26/20 112/62   Wt Readings from Last 3 Encounters:  10/01/20 171 lb (77.6 kg)  08/27/20 177 lb 4.8 oz (80.4 kg)  06/26/20 182 lb 12.8 oz (82.9 kg)   Reviewed past Medical, Social and Family history today.  Outpatient Medications Prior to Visit  Medication Sig Dispense Refill  . losartan (COZAAR) 25 MG tablet Take 0.5 tablets (12.5 mg total) by mouth daily. 30 tablet 3  . pantoprazole (PROTONIX) 40 MG tablet Take 1 tablet (40 mg total) by mouth daily. (Patient not taking: Reported on 10/01/2020) 90 tablet 0  . atorvastatin (LIPITOR) 20 MG tablet Take 1 tablet (20 mg total) by  mouth daily. (Patient not taking: Reported on 10/01/2020) 90 tablet 3  . furosemide (LASIX) 20 MG tablet Take 20 mg by mouth as needed. In the evening (Patient not taking: Reported on 10/01/2020)    . furosemide (LASIX) 40 MG tablet Take 1 tablet (40 mg total) by mouth daily. (Patient not taking: Reported on 10/01/2020) 30 tablet 2  . metoprolol succinate (TOPROL-XL) 25 MG 24 hr tablet TAKE 1 TABLET BY MOUTH EVERY DAY (Patient not taking: Reported on 10/01/2020) 90 tablet 1  . potassium chloride SA (KLOR-CON) 20 MEQ tablet Take 1 tablet (20 mEq total) by mouth daily. (Patient not taking: Reported on 08/27/2020) 90 tablet 1   No facility-administered medications prior to visit.   ROS See HPI  Objective:  BP 110/64 (BP Location: Left Arm, Patient Position: Sitting, Cuff Size: Normal)   Pulse 87   Temp (!) 96.6 F (35.9 C) (Temporal)   Ht 5\' 11"  (1.803 m)   Wt 171 lb (77.6 kg)   SpO2 98%   BMI 23.85 kg/m   Physical Exam Vitals reviewed.  Constitutional:      General: He is not in acute distress. Cardiovascular:     Rate and Rhythm: Normal rate and regular rhythm.     Pulses: Normal pulses.     Heart sounds: Normal heart sounds.  Pulmonary:     Effort: Pulmonary effort is normal.     Breath sounds: Normal breath sounds.  Abdominal:     General: There is no distension.  Musculoskeletal:     Right lower leg: No edema.     Left lower  leg: No edema.  Neurological:     Mental Status: He is alert and oriented to person, place, and time.    Assessment & Plan:  This visit occurred during the SARS-CoV-2 public health emergency.  Safety protocols were in place, including screening questions prior to the visit, additional usage of staff PPE, and extensive cleaning of exam room while observing appropriate contact time as indicated for disinfecting solutions.   [name] was seen today for medication problem.  Diagnoses and all orders for this visit:  Gastroesophageal reflux disease without  esophagitis  Chronic systolic CHF (congestive heart failure), NYHA class 4 (HCC) -     metoprolol succinate (TOPROL-XL) 25 MG 24 hr tablet; Take 1 tablet (25 mg total) by mouth daily. -     Comprehensive metabolic panel -     B Nat Peptide -     potassium chloride SA (KLOR-CON) 20 MEQ tablet; Take 1 tablet (20 mEq total) by mouth daily.  ASCVD (arteriosclerotic cardiovascular disease) -     atorvastatin (LIPITOR) 20 MG tablet; Take 1 tablet (20 mg total) by mouth daily.  Dyspnea on exertion -     Comprehensive metabolic panel -     CBC w/Diff -     B Nat Peptide  Palpitation -     Comprehensive metabolic panel -     TSH  Chronic systolic (congestive) heart failure (HCC) -     furosemide (LASIX) 20 MG tablet; Take 1 tablet (20 mg total) by mouth daily.  Resume metoprolol and atorvastatin. Go to lab for blood draw. Schedule f/up with CHF clinic ([phone]) and oncology radiation ([phone]).  Problem List Items Addressed This Visit      Cardiovascular and Mediastinum   ASCVD (arteriosclerotic cardiovascular disease)   Relevant Medications   metoprolol succinate (TOPROL-XL) 25 MG 24 hr tablet   atorvastatin (LIPITOR) 20 MG tablet   furosemide (LASIX) 20 MG tablet   Chronic systolic CHF (congestive heart failure), NYHA class 4 (HCC) (Chronic)   Relevant Medications   metoprolol succinate (TOPROL-XL) 25 MG 24 hr tablet   atorvastatin (LIPITOR) 20 MG tablet   furosemide (LASIX) 20 MG tablet   potassium chloride SA (KLOR-CON) 20 MEQ tablet   Other Relevant Orders   Comprehensive metabolic panel (Completed)   B Nat Peptide (Completed)     Digestive   GERD (gastroesophageal reflux disease) - Primary (Chronic)    Other Visit Diagnoses    Dyspnea on exertion       Relevant Orders   Comprehensive metabolic panel (Completed)   CBC w/Diff (Completed)   B Nat Peptide (Completed)   Palpitation       Relevant Orders   Comprehensive metabolic panel (Completed)   TSH  (Completed)   Chronic systolic (congestive) heart failure (HCC)       Relevant Medications   metoprolol succinate (TOPROL-XL) 25 MG 24 hr tablet   atorvastatin (LIPITOR) 20 MG tablet   furosemide (LASIX) 20 MG tablet      Follow-up: No follow-ups on file.  Wilfred Lacy, NP

## 2020-10-02 LAB — CBC WITH DIFFERENTIAL/PLATELET
Basophils Absolute: 0 10*3/uL (ref 0.0–0.1)
Basophils Relative: 0.6 % (ref 0.0–3.0)
Eosinophils Absolute: 0.1 10*3/uL (ref 0.0–0.7)
Eosinophils Relative: 2 % (ref 0.0–5.0)
HCT: 41.7 % (ref 39.0–52.0)
Hemoglobin: 13.5 g/dL (ref 13.0–17.0)
Lymphocytes Relative: 12.6 % (ref 12.0–46.0)
Lymphs Abs: 0.8 10*3/uL (ref 0.7–4.0)
MCHC: 32.3 g/dL (ref 30.0–36.0)
MCV: 88.2 fl (ref 78.0–100.0)
Monocytes Absolute: 0.5 10*3/uL (ref 0.1–1.0)
Monocytes Relative: 9 % (ref 3.0–12.0)
Neutro Abs: 4.6 10*3/uL (ref 1.4–7.7)
Neutrophils Relative %: 75.8 % (ref 43.0–77.0)
Platelets: 324 10*3/uL (ref 150.0–400.0)
RBC: 4.72 Mil/uL (ref 4.22–5.81)
RDW: 13.5 % (ref 11.5–15.5)
WBC: 6.1 10*3/uL (ref 4.0–10.5)

## 2020-10-02 LAB — COMPREHENSIVE METABOLIC PANEL
ALT: 15 U/L (ref 0–53)
AST: 24 U/L (ref 0–37)
Albumin: 4.1 g/dL (ref 3.5–5.2)
Alkaline Phosphatase: 97 U/L (ref 39–117)
BUN: 23 mg/dL (ref 6–23)
CO2: 27 mEq/L (ref 19–32)
Calcium: 9.4 mg/dL (ref 8.4–10.5)
Chloride: 103 mEq/L (ref 96–112)
Creatinine, Ser: 0.94 mg/dL (ref 0.40–1.50)
GFR: 81.89 mL/min (ref >60.00)
Glucose, Bld: 96 mg/dL (ref 70–99)
Potassium: 4.2 mEq/L (ref 3.5–5.1)
Sodium: 137 mEq/L (ref 135–145)
Total Bilirubin: 1 mg/dL (ref 0.2–1.2)
Total Protein: 8 g/dL (ref 6.0–8.3)

## 2020-10-02 LAB — TSH: TSH: 3.19 u[IU]/mL (ref 0.35–4.50)

## 2020-10-02 LAB — BRAIN NATRIURETIC PEPTIDE: Pro B Natriuretic peptide (BNP): 563 pg/mL — ABNORMAL HIGH (ref 0.0–100.0)

## 2020-10-03 MED ORDER — FUROSEMIDE 20 MG PO TABS: 20.0000 mg | ORAL_TABLET | Freq: Every day | ORAL | 0 refills | Status: DC

## 2020-10-03 MED ORDER — POTASSIUM CHLORIDE CRYS ER 20 MEQ PO TBCR: 20.0000 meq | EXTENDED_RELEASE_TABLET | Freq: Every day | ORAL | 0 refills | Status: DC

## 2020-10-03 NOTE — Progress Notes (Signed)
LMTCB

## 2020-10-06 ENCOUNTER — Ambulatory Visit
Admission: RE | Admit: 2020-10-06 | Discharge: 2020-10-06 | Disposition: A | Discharge status: Home / Self Care | Payer: Medicare Other | Source: Ambulatory Visit | Attending: Radiation Oncology | Admitting: Radiation Oncology

## 2020-10-19 NOTE — Progress Notes (Signed)
Radiation Oncology         [phone] ________________________________  Name: Philip Campbell MRN: [id number]  Date: 10/20/2020  DOB: 1951/06/19  Follow-Up Visit Note  CC: Libby Maw, MD  Volanda Napoleon, MD    ICD-10-CM   1. Mass of upper lobe of right lung  R91.8 CT Chest Wo Contrast    Diagnosis: Right upper lung nodule, presumednon-small cell lung cancer  Interval Since Last Radiation: Four months and three weeks  Radiation Treatment Dates: 05/21/2020 through 05/30/2020 Site Technique Total Dose (Gy) Dose per Fx (Gy) Completed Fx Beam Energies  Lung, Right: Lung_Rt IMRT 60/60 12 5/5 6XFFF    Narrative:  The patient returns today for routine follow-up. He no-showed for his CT scans scheduled back in August, no-showed for his appointment with Dr. Marin Olp on 07/02/2020, and no-showed for his one-month follow-up with me on 07/03/2020. Unfortunately, the scans were never completed.  On review of systems, he reports feeling overall fatigued.  He reports his heart racing. He denies significant cough or hemoptysis he denies any chest pain.  ALLERGIES:  has No Known Allergies.  Meds: Current Outpatient Medications  Medication Sig Dispense Refill  . atorvastatin (LIPITOR) 20 MG tablet Take 1 tablet (20 mg total) by mouth daily. 90 tablet 3  . furosemide (LASIX) 20 MG tablet Take 1 tablet (20 mg total) by mouth daily. 90 tablet 0  . metoprolol succinate (TOPROL-XL) 25 MG 24 hr tablet Take 1 tablet (25 mg total) by mouth daily. 90 tablet 3  . pantoprazole (PROTONIX) 40 MG tablet Take 1 tablet (40 mg total) by mouth daily. 90 tablet 0  . losartan (COZAAR) 25 MG tablet Take 0.5 tablets (12.5 mg total) by mouth daily. 30 tablet 3  . potassium chloride SA (KLOR-CON) 20 MEQ tablet Take 1 tablet (20 mEq total) by mouth daily. (Patient not taking: Reported on 10/20/2020) 90 tablet 0   No current facility-administered medications for this encounter.    Physical  Findings: The patient is in no acute distress. Patient is alert and oriented.  weight is 170 lb 3.2 oz (77.2 kg). His oral temperature is 96.8 F (36 C) (abnormal). His blood pressure is 118/91 (abnormal) and his pulse is 132 (abnormal). His respiration is 20 and oxygen saturation is 100%.   Lungs are clear to auscultation bilaterally. Heart has irregular elevated rate and rhythm. No palpable cervical, supraclavicular, or axillary adenopathy. Abdomen soft, non-tender, normal bowel sounds.  Peripheral pulses erratic  Lab Findings: Lab Results  Component Value Date   WBC 6.1 10/01/2020   HGB 13.5 10/01/2020   HCT 41.7 10/01/2020   MCV 88.2 10/01/2020   PLT 324.0 10/01/2020    Radiographic Findings: No results found.  Impression:  Right upper lung nodule, presumednon-small cell lung cancer  Patient has been noncompliant with follow-ups as noted above.  Given the patient's clinical findings and concern for possible atrial fibrillation with a high ventricular rate I recommend the patient be taken to the emergency room for further evaluation.  Patient does not wish to proceed with this recommendation.  He will follow-up with his cardiologist as an outpatient soon as possible  Plan: The patient agrees to proceeding with a chest CT scan to assess his response to radiation therapy.  This is ordered this evening and will be performed in the next few days  Total time spent in this encounter was 20 minutes which included reviewing the patient's most recent interval history, physical examination, and documentation.  ____________________________________   Blair Promise, PhD, MD  This document serves as a record of services personally performed by Gery Pray, MD. It was created on his behalf by Clerance Lav, a trained medical scribe. The creation of this record is based on the scribe's personal observations and the provider's statements to them. This document has been checked and approved by  the attending provider.

## 2020-10-20 ENCOUNTER — Encounter: Payer: Self-pay | Admitting: Radiation Oncology

## 2020-10-20 ENCOUNTER — Ambulatory Visit
Admission: RE | Admit: 2020-10-20 | Discharge: 2020-10-20 | Disposition: A | Discharge status: Home / Self Care | Payer: Medicare Other | Source: Ambulatory Visit | Attending: Radiation Oncology | Admitting: Radiation Oncology

## 2020-10-20 ENCOUNTER — Other Ambulatory Visit: Payer: Self-pay

## 2020-10-20 VITALS — BP 118/91 | HR 132 | Temp 96.8°F | Resp 20 | Wt 170.2 lb

## 2020-10-20 DIAGNOSIS — R918 Other nonspecific abnormal finding of lung field: Secondary | ICD-10-CM | POA: Diagnosis not present

## 2020-10-20 DIAGNOSIS — Z08 Encounter for follow-up examination after completed treatment for malignant neoplasm: Secondary | ICD-10-CM | POA: Diagnosis not present

## 2020-10-20 DIAGNOSIS — R911 Solitary pulmonary nodule: Secondary | ICD-10-CM | POA: Insufficient documentation

## 2020-10-20 DIAGNOSIS — R000 Tachycardia, unspecified: Secondary | ICD-10-CM | POA: Diagnosis not present

## 2020-10-20 DIAGNOSIS — C3411 Malignant neoplasm of upper lobe, right bronchus or lung: Secondary | ICD-10-CM | POA: Diagnosis not present

## 2020-10-20 DIAGNOSIS — Z79899 Other long term (current) drug therapy: Secondary | ICD-10-CM | POA: Insufficient documentation

## 2020-10-20 DIAGNOSIS — I509 Heart failure, unspecified: Secondary | ICD-10-CM | POA: Diagnosis not present

## 2020-10-20 DIAGNOSIS — R5383 Other fatigue: Secondary | ICD-10-CM | POA: Diagnosis not present
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
## 2020-10-25 ENCOUNTER — Emergency Department (HOSPITAL_COMMUNITY): Payer: Medicare Other

## 2020-10-25 ENCOUNTER — Inpatient Hospital Stay (HOSPITAL_COMMUNITY)
Admission: EM | Admit: 2020-10-25 | Discharge: 2020-10-29 | DRG: 291 | Disposition: A | Discharge status: Hospice | Payer: Medicare Other | Attending: Internal Medicine | Admitting: Internal Medicine

## 2020-10-25 ENCOUNTER — Encounter (HOSPITAL_COMMUNITY): Payer: Self-pay | Admitting: Emergency Medicine

## 2020-10-25 DIAGNOSIS — Z515 Encounter for palliative care: Secondary | ICD-10-CM | POA: Diagnosis not present

## 2020-10-25 DIAGNOSIS — Z20822 Contact with and (suspected) exposure to covid-19: Secondary | ICD-10-CM | POA: Diagnosis present

## 2020-10-25 DIAGNOSIS — K761 Chronic passive congestion of liver: Secondary | ICD-10-CM | POA: Diagnosis not present

## 2020-10-25 DIAGNOSIS — R7989 Other specified abnormal findings of blood chemistry: Secondary | ICD-10-CM | POA: Diagnosis not present

## 2020-10-25 DIAGNOSIS — R0600 Dyspnea, unspecified: Secondary | ICD-10-CM | POA: Diagnosis not present

## 2020-10-25 DIAGNOSIS — I4581 Long QT syndrome: Secondary | ICD-10-CM | POA: Diagnosis present

## 2020-10-25 DIAGNOSIS — J701 Chronic and other pulmonary manifestations due to radiation: Secondary | ICD-10-CM | POA: Diagnosis not present

## 2020-10-25 DIAGNOSIS — R54 Age-related physical debility: Secondary | ICD-10-CM | POA: Diagnosis present

## 2020-10-25 DIAGNOSIS — I5023 Acute on chronic systolic (congestive) heart failure: Secondary | ICD-10-CM

## 2020-10-25 DIAGNOSIS — D684 Acquired coagulation factor deficiency: Secondary | ICD-10-CM | POA: Diagnosis present

## 2020-10-25 DIAGNOSIS — I2510 Atherosclerotic heart disease of native coronary artery without angina pectoris: Secondary | ICD-10-CM | POA: Diagnosis present

## 2020-10-25 DIAGNOSIS — R7401 Elevation of levels of liver transaminase levels: Secondary | ICD-10-CM | POA: Diagnosis not present

## 2020-10-25 DIAGNOSIS — Z79899 Other long term (current) drug therapy: Secondary | ICD-10-CM

## 2020-10-25 DIAGNOSIS — R627 Adult failure to thrive: Secondary | ICD-10-CM | POA: Diagnosis not present

## 2020-10-25 DIAGNOSIS — I5043 Acute on chronic combined systolic (congestive) and diastolic (congestive) heart failure: Secondary | ICD-10-CM | POA: Diagnosis present

## 2020-10-25 DIAGNOSIS — C3411 Malignant neoplasm of upper lobe, right bronchus or lung: Secondary | ICD-10-CM | POA: Diagnosis not present

## 2020-10-25 DIAGNOSIS — Z23 Encounter for immunization: Secondary | ICD-10-CM

## 2020-10-25 DIAGNOSIS — I130 Hypertensive heart and chronic kidney disease with heart failure and stage 1 through stage 4 chronic kidney disease, or unspecified chronic kidney disease: Principal | ICD-10-CM | POA: Diagnosis present

## 2020-10-25 DIAGNOSIS — R945 Abnormal results of liver function studies: Secondary | ICD-10-CM | POA: Diagnosis not present

## 2020-10-25 DIAGNOSIS — I2699 Other pulmonary embolism without acute cor pulmonale: Secondary | ICD-10-CM | POA: Diagnosis not present

## 2020-10-25 DIAGNOSIS — Z9119 Patient's noncompliance with other medical treatment and regimen: Secondary | ICD-10-CM

## 2020-10-25 DIAGNOSIS — I517 Cardiomegaly: Secondary | ICD-10-CM | POA: Diagnosis not present

## 2020-10-25 DIAGNOSIS — I361 Nonrheumatic tricuspid (valve) insufficiency: Secondary | ICD-10-CM | POA: Diagnosis not present

## 2020-10-25 DIAGNOSIS — R000 Tachycardia, unspecified: Secondary | ICD-10-CM | POA: Diagnosis not present

## 2020-10-25 DIAGNOSIS — I340 Nonrheumatic mitral (valve) insufficiency: Secondary | ICD-10-CM | POA: Diagnosis not present

## 2020-10-25 DIAGNOSIS — N179 Acute kidney failure, unspecified: Secondary | ICD-10-CM | POA: Diagnosis present

## 2020-10-25 DIAGNOSIS — R911 Solitary pulmonary nodule: Secondary | ICD-10-CM | POA: Diagnosis not present

## 2020-10-25 DIAGNOSIS — Y842 Radiological procedure and radiotherapy as the cause of abnormal reaction of the patient, or of later complication, without mention of misadventure at the time of the procedure: Secondary | ICD-10-CM | POA: Diagnosis present

## 2020-10-25 DIAGNOSIS — I509 Heart failure, unspecified: Secondary | ICD-10-CM | POA: Diagnosis not present

## 2020-10-25 DIAGNOSIS — Z91138 Patient's unintentional underdosing of medication regimen for other reason: Secondary | ICD-10-CM

## 2020-10-25 DIAGNOSIS — D689 Coagulation defect, unspecified: Secondary | ICD-10-CM | POA: Diagnosis not present

## 2020-10-25 DIAGNOSIS — E876 Hypokalemia: Secondary | ICD-10-CM | POA: Diagnosis not present

## 2020-10-25 DIAGNOSIS — R0602 Shortness of breath: Secondary | ICD-10-CM | POA: Diagnosis not present

## 2020-10-25 DIAGNOSIS — Z7189 Other specified counseling: Secondary | ICD-10-CM | POA: Diagnosis not present

## 2020-10-25 DIAGNOSIS — N182 Chronic kidney disease, stage 2 (mild): Secondary | ICD-10-CM | POA: Diagnosis present

## 2020-10-25 DIAGNOSIS — I5084 End stage heart failure: Secondary | ICD-10-CM | POA: Diagnosis not present

## 2020-10-25 DIAGNOSIS — J90 Pleural effusion, not elsewhere classified: Secondary | ICD-10-CM | POA: Diagnosis not present

## 2020-10-25 DIAGNOSIS — Z8249 Family history of ischemic heart disease and other diseases of the circulatory system: Secondary | ICD-10-CM

## 2020-10-25 DIAGNOSIS — Z87891 Personal history of nicotine dependence: Secondary | ICD-10-CM

## 2020-10-25 DIAGNOSIS — I428 Other cardiomyopathies: Secondary | ICD-10-CM | POA: Diagnosis not present

## 2020-10-25 DIAGNOSIS — Z9049 Acquired absence of other specified parts of digestive tract: Secondary | ICD-10-CM

## 2020-10-25 DIAGNOSIS — R918 Other nonspecific abnormal finding of lung field: Secondary | ICD-10-CM | POA: Diagnosis not present

## 2020-10-25 DIAGNOSIS — Z66 Do not resuscitate: Secondary | ICD-10-CM | POA: Diagnosis not present

## 2020-10-25 DIAGNOSIS — J9601 Acute respiratory failure with hypoxia: Secondary | ICD-10-CM | POA: Diagnosis not present

## 2020-10-25 DIAGNOSIS — T501X6A Underdosing of loop [high-ceiling] diuretics, initial encounter: Secondary | ICD-10-CM | POA: Diagnosis not present

## 2020-10-25 DIAGNOSIS — I5022 Chronic systolic (congestive) heart failure: Secondary | ICD-10-CM

## 2020-10-25 DIAGNOSIS — I959 Hypotension, unspecified: Secondary | ICD-10-CM | POA: Diagnosis present

## 2020-10-25 DIAGNOSIS — I351 Nonrheumatic aortic (valve) insufficiency: Secondary | ICD-10-CM | POA: Diagnosis not present

## 2020-10-25 LAB — CBC
HCT: 36.1 % — ABNORMAL LOW (ref 39.0–52.0)
Hemoglobin: 11.4 g/dL — ABNORMAL LOW (ref 13.0–17.0)
MCH: 28.5 pg (ref 26.0–34.0)
MCHC: 31.6 g/dL (ref 30.0–36.0)
MCV: 90.3 fL (ref 80.0–100.0)
Platelets: 278 10*3/uL (ref 150–400)
RBC: 4 MIL/uL — ABNORMAL LOW (ref 4.22–5.81)
RDW: 15.1 % (ref 11.5–15.5)
WBC: 10.9 10*3/uL — ABNORMAL HIGH (ref 4.0–10.5)
nRBC: 0.2 % (ref 0.0–0.2)

## 2020-10-25 LAB — BASIC METABOLIC PANEL
Anion gap: 17 — ABNORMAL HIGH (ref 5–15)
BUN: 46 mg/dL — ABNORMAL HIGH (ref 8–23)
CO2: 19 mmol/L — ABNORMAL LOW (ref 22–32)
Calcium: 8.7 mg/dL — ABNORMAL LOW (ref 8.9–10.3)
Chloride: 97 mmol/L — ABNORMAL LOW (ref 98–111)
Creatinine, Ser: 1.68 mg/dL — ABNORMAL HIGH (ref 0.61–1.24)
GFR, Estimated: 43 mL/min — ABNORMAL LOW (ref >60)
Glucose, Bld: 157 mg/dL — ABNORMAL HIGH (ref 70–99)
Potassium: 4.3 mmol/L (ref 3.5–5.1)
Sodium: 133 mmol/L — ABNORMAL LOW (ref 135–145)

## 2020-10-25 LAB — BLOOD GAS, ARTERIAL
Acid-base deficit: 3 mmol/L — ABNORMAL HIGH (ref 0.0–2.0)
Bicarbonate: 18.4 mmol/L — ABNORMAL LOW (ref 20.0–28.0)
Drawn by: 560031
FIO2: 21
O2 Saturation: 94.8 %
Patient temperature: 98.6
pCO2 arterial: 23.2 mmHg — ABNORMAL LOW (ref 32.0–48.0)
pH, Arterial: 7.51 — ABNORMAL HIGH (ref 7.350–7.450)
pO2, Arterial: 79.3 mmHg — ABNORMAL LOW (ref 83.0–108.0)

## 2020-10-25 LAB — BRAIN NATRIURETIC PEPTIDE: B Natriuretic Peptide: 2346.9 pg/mL — ABNORMAL HIGH (ref 0.0–100.0)

## 2020-10-25 LAB — PROTIME-INR
INR: 2.2 — ABNORMAL HIGH (ref 0.8–1.2)
Prothrombin Time: 23.3 seconds — ABNORMAL HIGH (ref 11.4–15.2)

## 2020-10-25 LAB — RESP PANEL BY RT-PCR (FLU A&B, COVID) ARPGX2
Influenza A by PCR: NEGATIVE
Influenza B by PCR: NEGATIVE
SARS Coronavirus 2 by RT PCR: NEGATIVE

## 2020-10-25 LAB — MAGNESIUM: Magnesium: 1.9 mg/dL (ref 1.7–2.4)

## 2020-10-25 MED ORDER — ENOXAPARIN SODIUM 40 MG/0.4ML ~~LOC~~ SOLN
40.0000 mg | SUBCUTANEOUS | Status: DC
  Administered 2020-10-25 – 2020-10-28 (×4): 40 mg via SUBCUTANEOUS
  Filled 2020-10-25 (×4): qty 0.4

## 2020-10-25 MED ORDER — SODIUM CHLORIDE 0.9% FLUSH: 3.0000 mL | INTRAVENOUS | Status: DC | PRN

## 2020-10-25 MED ORDER — PANTOPRAZOLE SODIUM 40 MG PO TBEC
40.0000 mg | DELAYED_RELEASE_TABLET | Freq: Every day | ORAL | Status: DC
  Administered 2020-10-26 – 2020-10-29 (×4): 40 mg via ORAL
  Filled 2020-10-25 (×4): qty 1

## 2020-10-25 MED ORDER — METOPROLOL SUCCINATE ER 25 MG PO TB24
25.0000 mg | ORAL_TABLET | Freq: Every day | ORAL | Status: DC
  Administered 2020-10-26 – 2020-10-29 (×4): 25 mg via ORAL
  Filled 2020-10-25 (×4): qty 1

## 2020-10-25 MED ORDER — ATORVASTATIN CALCIUM 20 MG PO TABS
20.0000 mg | ORAL_TABLET | Freq: Every day | ORAL | Status: DC
  Filled 2020-10-25: qty 1

## 2020-10-25 MED ORDER — FUROSEMIDE 10 MG/ML IJ SOLN
60.0000 mg | Freq: Once | INTRAMUSCULAR | Status: AC
  Administered 2020-10-25: 60 mg via INTRAVENOUS
  Filled 2020-10-25: qty 8

## 2020-10-25 MED ORDER — IOHEXOL 350 MG/ML SOLN
80.0000 mL | Freq: Once | INTRAVENOUS | Status: AC | PRN
  Administered 2020-10-25: 80 mL via INTRAVENOUS

## 2020-10-25 MED ORDER — SODIUM CHLORIDE 0.9 % IV SOLN: 250.0000 mL | INTRAVENOUS | Status: DC | PRN

## 2020-10-25 MED ORDER — SODIUM CHLORIDE 0.9% FLUSH
3.0000 mL | Freq: Two times a day (BID) | INTRAVENOUS | Status: DC
  Administered 2020-10-25 – 2020-10-29 (×8): 3 mL via INTRAVENOUS

## 2020-10-25 MED ORDER — FUROSEMIDE 10 MG/ML IJ SOLN
20.0000 mg | Freq: Two times a day (BID) | INTRAMUSCULAR | Status: DC
  Administered 2020-10-26: 20 mg via INTRAVENOUS
  Filled 2020-10-25: qty 2

## 2020-10-25 NOTE — ED Provider Notes (Signed)
[facility] DEPT Provider Note   CSN: [id number] Arrival date & time: 10/25/20  1208     History Chief Complaint  Patient presents with  . Cough    Philip Campbell is a 70 y.o. male.  HPI   70 yo male ho chf, lung cancer presents today with increasing dyspnea for a month.  Patient has not been on meds for several months and was lost to follow up by oncology for several months.  He reports that his primary care doctor told him to come to the ED due to heart failure.  He has  Had cough, but denies increased peripheral edema.  Review of records shows patient has been restarted on his meds, but unclear if taking them. He denies chest pain, fever, or chills.  He has had some productive cough.    From oncology note 10/20/20  Narrative:  The patient returns today for routine follow-up. He no-showed for his CT scans scheduled back in August, no-showed for his appointment with Dr. Marin Olp on 07/02/2020, and no-showed for his one-month follow-up with me on 07/03/2020. Unfortunately, the scans were never completed.     Patient has been noncompliant with follow-ups as noted above.  Given the patient's clinical findings and concern for possible atrial fibrillation with a high ventricular rate I recommend the patient be taken to the emergency room for further evaluation.  Patient does not wish to proceed with this recommendation.  He will follow-up with his cardiologist as an outpatient soon as possible  Past Medical History:  Diagnosis Date  . Chronic systolic CHF (congestive heart failure) ([place])   . Goals of care, counseling/discussion 04/23/2020    Patient Active Problem List   Diagnosis Date Noted  . Goals of care, counseling/discussion 04/23/2020  . ASCVD (arteriosclerotic cardiovascular disease) 02/21/2020  . Pleural effusion 02/21/2020  . Elevated glucose 02/21/2020  . Acute on chronic systolic CHF (congestive heart failure) ([place]) 01/23/2020  . GERD  (gastroesophageal reflux disease) 01/23/2020  . Chronic systolic CHF (congestive heart failure), NYHA class 4 ([place]) 01/23/2020  . Non-ischemic cardiomyopathy ([place]) 01/23/2020  . Acute respiratory failure with hypoxia ([place]) 01/23/2020  . Moderate to severe mitral regurgitation 01/23/2020  . Mass of upper lobe of right lung 01/23/2020    Past Surgical History:  Procedure Laterality Date  . ELBOW BURSA SURGERY    . GALLBLADDER SURGERY    . HERNIA REPAIR         Family History  Problem Relation Age of Onset  . CAD Mother        CABG x3 in her 46's  . Alcoholism Mother   . Heart failure Neg Hx     Social History   Tobacco Use  . Smoking status: Never Smoker  . Smokeless tobacco: Never Used  Vaping Use  . Vaping Use: Never used  Substance Use Topics  . Alcohol use: Never  . Drug use: Never    Home Medications Prior to Admission medications   Medication Sig Start Date End Date Taking? Authorizing Provider  atorvastatin (LIPITOR) 20 MG tablet Take 1 tablet (20 mg total) by mouth daily. 10/01/20   Nche, Charlene Brooke, NP  furosemide (LASIX) 20 MG tablet Take 1 tablet (20 mg total) by mouth daily. 10/03/20   Nche, Charlene Brooke, NP  losartan (COZAAR) 25 MG tablet Take 0.5 tablets (12.5 mg total) by mouth daily. 03/25/20 06/23/20  Donato Heinz, MD  metoprolol succinate (TOPROL-XL) 25 MG 24 hr tablet Take 1  tablet (25 mg total) by mouth daily. 10/01/20   Nche, Charlene Brooke, NP  pantoprazole (PROTONIX) 40 MG tablet Take 1 tablet (40 mg total) by mouth daily. 08/05/20   Libby Maw, MD  potassium chloride SA (KLOR-CON) 20 MEQ tablet Take 1 tablet (20 mEq total) by mouth daily. Patient not taking: Reported on 10/20/2020 10/03/20   Nche, Charlene Brooke, NP    Allergies    Patient has no known allergies.  Review of Systems   Review of Systems  Physical Exam Updated Vital Signs BP (!) 166/54 (BP Location: Right Arm)   Pulse (!) 101   Temp 97.9 F (36.6 C)  (Oral)   Resp 18   SpO2 97%   Physical Exam Vitals and nursing note reviewed.  Constitutional:      Appearance: He is well-developed.  HENT:     Head: Normocephalic and atraumatic.     Right Ear: External ear normal.     Left Ear: External ear normal.     Nose: Nose normal.     Mouth/Throat:     Mouth: Mucous membranes are moist.  Eyes:     Extraocular Movements: Extraocular movements intact and EOM normal.     Pupils: Pupils are equal, round, and reactive to light.  Neck:     Trachea: No tracheal deviation.  Cardiovascular:     Rate and Rhythm: Normal rate and regular rhythm.  Pulmonary:     Effort: Pulmonary effort is normal.  Abdominal:     General: Abdomen is flat.     Palpations: Abdomen is soft.  Musculoskeletal:        General: Normal range of motion.     Cervical back: Normal range of motion.  Skin:    General: Skin is warm and dry.  Neurological:     Mental Status: He is alert and oriented to person, place, and time.  Psychiatric:        Mood and Affect: Mood and affect normal.        Behavior: Behavior normal.     ED Results / Procedures / Treatments   Labs (all labs ordered are listed, but only abnormal results are displayed) Labs Reviewed  BASIC METABOLIC PANEL - Abnormal; Notable for the following components:      Result Value   Sodium 133 (*)    Chloride 97 (*)    CO2 19 (*)    Glucose, Bld 157 (*)    BUN 46 (*)    Creatinine, Ser 1.68 (*)    Calcium 8.7 (*)    GFR, Estimated 43 (*)    Anion gap 17 (*)    All other components within normal limits  CBC - Abnormal; Notable for the following components:   WBC 10.9 (*)    RBC 4.00 (*)    Hemoglobin 11.4 (*)    HCT 36.1 (*)    All other components within normal limits  PROTIME-INR - Abnormal; Notable for the following components:   Prothrombin Time 23.3 (*)    INR 2.2 (*)    All other components within normal limits  BLOOD GAS, ARTERIAL - Abnormal; Notable for the following components:   pH,  Arterial 7.510 (*)    pCO2 arterial 23.2 (*)    pO2, Arterial 79.3 (*)    Bicarbonate 18.4 (*)    Acid-base deficit 3.0 (*)    All other components within normal limits  RESP PANEL BY RT-PCR (FLU A&B, COVID) ARPGX2  BRAIN NATRIURETIC PEPTIDE  EKG EKG Interpretation  Date/Time:  Saturday October 25 2020 12:12:06 EST Ventricular Rate:  101 PR Interval:  186 QRS Duration: 132 QT Interval:  404 QTC Calculation: 523 R Axis:   -61 Text Interpretation: Sinus tachycardia Left bundle branch block Abnormal ECG SINCE LAST TRACING HEART RATE HAS INCREASED Confirmed by Pattricia Boss [phone]) on 10/25/2020 12:23:04 PM   Radiology DG Chest 2 View  Result Date: 10/25/2020 CLINICAL DATA:  Heart from earlier, shortness of breath. EXAM: CHEST - 2 VIEW COMPARISON:  Mar 19, 2020 FINDINGS: The mediastinal contour is normal. Heart size is enlarged. Right upper lobe mass is more prominent compared to prior chest x-Datrell Dunton. Increased pulmonary interstitium is identified in the left lung. IMPRESSION: 1. Right upper lobe mass is more prominent compared to prior chest x-Reganne Messerschmidt. 2. Mild congestive heart failure. Electronically Signed   By: Abelardo Diesel M.D.   On: 10/25/2020 12:44    Procedures Procedures (including critical care time)  Medications Ordered in ED Medications  furosemide (LASIX) injection 60 mg (has no administration in time range)    ED Course  I have reviewed the triage vital signs and the nursing notes.  Pertinent labs & imaging results that were available during my care of the patient were reviewed by me and considered in my medical decision making (see chart for details).    MDM Rules/Calculators/A&P                          Patient with dyspnea, likely chf component Awaiting cta Covid results Discussed with Dr. Darl Householder and he will follow up. Final Clinical Impression(s) / ED Diagnoses Final diagnoses:  Dyspnea, unspecified type    Rx / DC Orders ED Discharge Orders    None        Pattricia Boss, MD 10/25/20 1515

## 2020-10-25 NOTE — ED Provider Notes (Signed)
  Physical Exam  BP 106/82   Pulse 79   Temp 97.9 F (36.6 C) (Oral)   Resp 18   SpO2 93%   Physical Exam  ED Course/Procedures     Procedures  MDM  Care assumed at 3 pm. Patient here with SOB.  Patient appears to be volume overloaded.  Patient has BNP that is elevated at 2200. Signout pending Covid test and CTA chest  4:25 PM CTA did not show any PE, COVID negative. Patient does have treatment changes to the right lung from radiation therapy.  Patient ambulated hence oxygen desatted to 65 to 72%.  His ABG is reassuring.  Given Lasix in the ED and will admit for hypoxia from CHF.   CRITICAL CARE Performed by: Wandra Arthurs   Total critical care time: 30 minutes  Critical care time was exclusive of separately billable procedures and treating other patients.  Critical care was necessary to treat or prevent imminent or life-threatening deterioration.  Critical care was time spent personally by me on the following activities: development of treatment plan with patient and/or surrogate as well as nursing, discussions with consultants, evaluation of patient's response to treatment, examination of patient, obtaining history from patient or surrogate, ordering and performing treatments and interventions, ordering and review of laboratory studies, ordering and review of radiographic studies, pulse oximetry and re-evaluation of patient's condition.     Drenda Freeze, MD 10/25/20 [phone]

## 2020-10-25 NOTE — H&P (Signed)
TRH H&P    Patient Demographics:    Philip Campbell, is a 70 y.o. male  MRN: [id number]  DOB - 12-16-1950  Admit Date - 10/25/2020  Referring MD/NP/PA: Dr Darl Householder  Outpatient Primary MD for the patient is Libby Maw, MD  Patient coming from: Home  Chief complaint-shortness of breath   HPI:    Philip Campbell  is a 70 y.o. male, with history chronic systolic CHF, EF 35% as of April 2021,  right upper lung nodule, presumed non-small cell lung cancer, status post radiation treatment 4 months ago, who was recently seen by radiation oncology on 10/20/2020, at that time he was found to be tachycardic and was recommended to go to ED for further evaluation.  Patient refused to come to the ED.  As per patient he has been getting short of breath for past few months.  Denies coughing up any phlegm.  Denies fever or chills.  He has not been compliant with his Lasix.  He denies nausea vomiting or diarrhea.  Denies any chest pain. In the ED, patient was found to have BNP elevated to 2346.9.  Chest x-ray showed mild congestive heart failure.  He is currently not requiring oxygen. Patient received Lasix 60 mg IV x1. Denies dysuria or abdominal pain    Review of systems:    In addition to the HPI above,    All other systems reviewed and are negative.    Past History of the following :    Past Medical History:  Diagnosis Date  . Chronic systolic CHF (congestive heart failure) ([place])   . Goals of care, counseling/discussion 04/23/2020      Past Surgical History:  Procedure Laterality Date  . ELBOW BURSA SURGERY    . GALLBLADDER SURGERY    . HERNIA REPAIR        Social History:      Social History   Tobacco Use  . Smoking status: Never Smoker  . Smokeless tobacco: Never Used  Substance Use Topics  . Alcohol use: Never       Family History :     Family History  Problem Relation Age of Onset  . CAD  Mother        CABG x3 in her 55's  . Alcoholism Mother   . Heart failure Neg Hx       Home Medications:   Prior to Admission medications   Medication Sig Start Date End Date Taking? Authorizing Provider  atorvastatin (LIPITOR) 20 MG tablet Take 1 tablet (20 mg total) by mouth daily. 10/01/20   Nche, Charlene Brooke, NP  furosemide (LASIX) 20 MG tablet Take 1 tablet (20 mg total) by mouth daily. 10/03/20   Nche, Charlene Brooke, NP  losartan (COZAAR) 25 MG tablet Take 0.5 tablets (12.5 mg total) by mouth daily. 03/25/20 06/23/20  Donato Heinz, MD  metoprolol succinate (TOPROL-XL) 25 MG 24 hr tablet Take 1 tablet (25 mg total) by mouth daily. 10/01/20   Nche, Charlene Brooke, NP  pantoprazole (PROTONIX) 40 MG tablet Take 1 tablet (  40 mg total) by mouth daily. 08/05/20   Libby Maw, MD  potassium chloride SA (KLOR-CON) 20 MEQ tablet Take 1 tablet (20 mEq total) by mouth daily. Patient not taking: Reported on 10/20/2020 10/03/20   Nche, Charlene Brooke, NP     Allergies:    No Known Allergies   Physical Exam:   Vitals  Blood pressure 106/82, pulse 79, temperature 97.9 F (36.6 C), temperature source Oral, resp. rate 18, SpO2 93 %.  1.  General: Appears in no acute distress  2. Psychiatric: Alert, oriented x3, intact insight and judgment  3. Neurologic: Cranial nerves II through XII grossly intact, no focal deficit noted  4. HEENMT:  Atraumatic normocephalic, extraocular muscles are intact  5. Respiratory : Clear to auscultation bilaterally, no wheezing or crackles auscultated  6. Cardiovascular : S1-S2, regular, no murmur auscultated  7. Gastrointestinal:  Abdomen is soft, nontender, no organomegaly     Data Review:    CBC Recent Labs  Lab 10/25/20 1247  WBC 10.9*  HGB 11.4*  HCT 36.1*  PLT 278  MCV 90.3  MCH 28.5  MCHC 31.6  RDW 15.1    ------------------------------------------------------------------------------------------------------------------  Results for orders placed or performed during the hospital encounter of 10/25/20 (from the past 48 hour(s))  Basic metabolic panel     Status: Abnormal   Collection Time: 10/25/20 12:47 PM  Result Value Ref Range   Sodium 133 (L) 135 - 145 mmol/L   Potassium 4.3 3.5 - 5.1 mmol/L   Chloride 97 (L) 98 - 111 mmol/L   CO2 19 (L) 22 - 32 mmol/L   Glucose, Bld 157 (H) 70 - 99 mg/dL    Comment: Glucose reference range applies only to samples taken after fasting for at least 8 hours.   BUN 46 (H) 8 - 23 mg/dL   Creatinine, Ser 1.68 (H) 0.61 - 1.24 mg/dL   Calcium 8.7 (L) 8.9 - 10.3 mg/dL   GFR, Estimated 43 (L) >60 mL/min    Comment: (NOTE) Calculated using the CKD-EPI Creatinine Equation (2021)    Anion gap 17 (H) 5 - 15    Comment: Performed at [facility], [place] [address] [place], [place] [address]  CBC     Status: Abnormal   Collection Time: 10/25/20 12:47 PM  Result Value Ref Range   WBC 10.9 (H) 4.0 - 10.5 K/uL   RBC 4.00 (L) 4.22 - 5.81 MIL/uL   Hemoglobin 11.4 (L) 13.0 - 17.0 g/dL   HCT 36.1 (L) 39.0 - 52.0 %   MCV 90.3 80.0 - 100.0 fL   MCH 28.5 26.0 - 34.0 pg   MCHC 31.6 30.0 - 36.0 g/dL   RDW 15.1 11.5 - 15.5 %   Platelets 278 150 - 400 K/uL   nRBC 0.2 0.0 - 0.2 %    Comment: Performed at [facility], [place] [address], [place], [place] [address]  Protime-INR (order if Patient is taking Coumadin / Warfarin)     Status: Abnormal   Collection Time: 10/25/20 12:47 PM  Result Value Ref Range   Prothrombin Time 23.3 (H) 11.4 - 15.2 seconds   INR 2.2 (H) 0.8 - 1.2    Comment: (NOTE) INR goal varies based on device and disease states. Performed at [facility], [place] [address] [place], [place] [address]   Brain natriuretic peptide     Status: Abnormal   Collection Time: 10/25/20  1:35 PM  Result Value  Ref Range  B Natriuretic Peptide 2,346.9 (H) 0.0 - 100.0 pg/mL    Comment: Performed at [facility], [place] [address] [place], [place] [address]  Blood gas, arterial (at [facility])     Status: Abnormal   Collection Time: 10/25/20  1:50 PM  Result Value Ref Range   FIO2 21.00    pH, Arterial 7.510 (H) 7.350 - 7.450   pCO2 arterial 23.2 (L) 32.0 - 48.0 mmHg   pO2, Arterial 79.3 (L) 83.0 - 108.0 mmHg   Bicarbonate 18.4 (L) 20.0 - 28.0 mmol/L   Acid-base deficit 3.0 (H) 0.0 - 2.0 mmol/L   O2 Saturation 94.8 %   Patient temperature 98.6    Collection site RIGHT RADIAL    Drawn by [id number]    Allens test (pass/fail) PASS PASS    Comment: Performed at [facility], [place] [address], [place], [place] [address]  Resp Panel by RT-PCR (Flu A&B, Covid) Nasopharyngeal Swab     Status: None   Collection Time: 10/25/20  2:20 PM   Specimen: Nasopharyngeal Swab; Nasopharyngeal(NP) swabs in vial transport medium  Result Value Ref Range   SARS Coronavirus 2 by RT PCR NEGATIVE NEGATIVE    Comment: (NOTE) SARS-CoV-2 target nucleic acids are NOT DETECTED.  The SARS-CoV-2 RNA is generally detectable in upper respiratory specimens during the acute phase of infection. The lowest concentration of SARS-CoV-2 viral copies this assay can detect is 138 copies/mL. A negative result does not preclude SARS-Cov-2 infection and should not be used as the sole basis for treatment or other patient management decisions. A negative result may occur with  improper specimen collection/handling, submission of specimen other than nasopharyngeal swab, presence of viral mutation(s) within the areas targeted by this assay, and inadequate number of viral copies(<138 copies/mL). A negative result must be combined with clinical observations, patient history, and epidemiological information. The expected result is Negative.  Fact Sheet for Patients:   EntrepreneurPulse.com.au  Fact Sheet for Healthcare Providers:  IncredibleEmployment.be  This test is no t yet approved or cleared by the [place] FDA and  has been authorized for detection and/or diagnosis of SARS-CoV-2 by FDA under an Emergency Use Authorization (EUA). This EUA will remain  in effect (meaning this test can be used) for the duration of the COVID-19 declaration under Section 564(b)(1) of the Act, 21 U.S.C.section 360bbb-3(b)(1), unless the authorization is terminated  or revoked sooner.       Influenza A by PCR NEGATIVE NEGATIVE   Influenza B by PCR NEGATIVE NEGATIVE    Comment: (NOTE) The Xpert Xpress SARS-CoV-2/FLU/RSV plus assay is intended as an aid in the diagnosis of influenza from Nasopharyngeal swab specimens and should not be used as a sole basis for treatment. Nasal washings and aspirates are unacceptable for Xpert Xpress SARS-CoV-2/FLU/RSV testing.  Fact Sheet for Patients: EntrepreneurPulse.com.au  Fact Sheet for Healthcare Providers: IncredibleEmployment.be  This test is not yet approved or cleared by the [place] FDA and has been authorized for detection and/or diagnosis of SARS-CoV-2 by FDA under an Emergency Use Authorization (EUA). This EUA will remain in effect (meaning this test can be used) for the duration of the COVID-19 declaration under Section 564(b)(1) of the Act, 21 U.S.C. section 360bbb-3(b)(1), unless the authorization is terminated or revoked.  Performed at [facility], [place] [address] [place], [place] [address]     Chemistries  Recent Labs  Lab 10/25/20 1247  NA 133*  K 4.3  CL 97*  CO2 19*  GLUCOSE 157*  BUN 46*  CREATININE 1.68*  CALCIUM 8.7*    ------------------------------------------------------------------------------------------------------------------  ------------------------------------------------------------------------------------------------------------------ GFR: Estimated Creatinine Clearance: 43 mL/min (A) (by C-G formula based on SCr of 1.68 mg/dL (H)). Liver Function Tests: No results for input(s): AST, ALT, ALKPHOS, BILITOT, PROT, ALBUMIN in the last 168 hours. No results for input(s): LIPASE, AMYLASE in the last 168 hours. No results for input(s): AMMONIA in the last 168 hours. Coagulation Profile: Recent Labs  Lab 10/25/20 1247  INR 2.2*   Cardiac Enzymes: No results for input(s): CKTOTAL, CKMB, CKMBINDEX, TROPONINI in the last 168 hours. BNP (last 3 results) Recent Labs    10/01/20 1441  PROBNP 563.0*    --------------------------------------------------------------------------------------------------------------- Urine analysis:    Component Value Date/Time   COLORURINE YELLOW 02/21/2020 1425   APPEARANCEUR CLEAR 02/21/2020 1425   LABSPEC 1.015 02/21/2020 1425   PHURINE 6.5 02/21/2020 1425   GLUCOSEU NEGATIVE 02/21/2020 1425   HGBUR NEGATIVE 02/21/2020 1425   BILIRUBINUR NEGATIVE 02/21/2020 1425   KETONESUR NEGATIVE 02/21/2020 1425   UROBILINOGEN 0.2 02/21/2020 1425   NITRITE NEGATIVE 02/21/2020 1425   LEUKOCYTESUR NEGATIVE 02/21/2020 1425      Imaging Results:    DG Chest 2 View  Result Date: 10/25/2020 CLINICAL DATA:  Heart from earlier, shortness of breath. EXAM: CHEST - 2 VIEW COMPARISON:  Mar 19, 2020 FINDINGS: The mediastinal contour is normal. Heart size is enlarged. Right upper lobe mass is more prominent compared to prior chest x-ray. Increased pulmonary interstitium is identified in the left lung. IMPRESSION: 1. Right upper lobe mass is more prominent compared to prior chest x-ray. 2. Mild congestive heart failure. Electronically Signed   By: Abelardo Diesel M.D.   On:  10/25/2020 12:44   CT Angio Chest PE W and/or Wo Contrast  Result Date: 10/25/2020 CLINICAL DATA:  70 year old male with suspected pulmonary embolism. Congestive heart failure and possible pneumonia. Evaluate for pulmonary embolism. EXAM: CT ANGIOGRAPHY CHEST WITH CONTRAST TECHNIQUE: Multidetector CT imaging of the chest was performed using the standard protocol during bolus administration of intravenous contrast. Multiplanar CT image reconstructions and MIPs were obtained to evaluate the vascular anatomy. CONTRAST:  50mL OMNIPAQUE IOHEXOL 350 MG/ML SOLN COMPARISON:  Chest CT 01/23/2020. FINDINGS: Comment: Today's study is limited by extensive patient respiratory motion. Cardiovascular: No definite central or lobar filling defects to suggest large pulmonary embolism. Unfortunately, smaller segmental and subsegmental sized of filling defects cannot be entirely excluded on the basis of today's motion limited examination. Heart size is mildly enlarged. There is no significant pericardial fluid, thickening or pericardial calcification. There is aortic atherosclerosis, as well as atherosclerosis of the great vessels of the mediastinum and the coronary arteries, including calcified atherosclerotic plaque in the left main, left anterior descending and left circumflex coronary arteries. Mild calcifications of the aortic valve. Mediastinum/Nodes: No pathologically enlarged mediastinal or hilar lymph nodes. Esophagus is unremarkable in appearance. No axillary lymphadenopathy. Lungs/Pleura: Previously noted right upper lobe pulmonary nodule is again noted present measuring approximately 2.7 x 2.2 cm, with new areas of surrounding architectural distortion, volume loss, ground-glass attenuation and septal thickening with extensive architectural distortion, most compatible with evolving postradiation changes. No pleural effusions. Upper Abdomen: Aortic atherosclerosis.  Status post cholecystectomy. Musculoskeletal: There are  no aggressive appearing lytic or blastic lesions noted in the visualized portions of the skeleton. Review of the MIP images confirms the above findings. IMPRESSION: 1. Severely limited examination demonstrating no central or lobar sized pulmonary embolism. Smaller segmental and subsegmental sized be entirely excluded pulmonary  emboli cannot on the basis of today's examination. 2. Treatment related changes surrounding patient's right upper lobe lung cancer, presumably from prior radiation therapy, as above. 3. Mild cardiomegaly. 4. Aortic atherosclerosis, in addition to left main and 2 vessel coronary artery disease. Assessment for potential risk factor modification, dietary therapy or pharmacologic therapy may be warranted, if clinically indicated. 5. There are calcifications of the aortic valve. Echocardiographic correlation for evaluation of potential valvular dysfunction may be warranted if clinically indicated. Aortic Atherosclerosis (ICD10-I70.0). Electronically Signed   By: Vinnie Langton M.D.   On: 10/25/2020 15:31    My personal review of EKG: Rhythm NSR left axis deviation, no ST-T changes   Assessment & Plan:    Active Problems:   CHF exacerbation ([place])   1. CHF exacerbation-patient has acute on chronic systolic CHF exacerbation.  He received Lasix 60 mg IV x1 in the ED.  He is currently not requiring oxygen.  Will assess the response, follow renal function in a.m.  We will start him on low-dose Lasix 20 mg IV every 12 hours from tomorrow morning.  Strict intake and output.  Monitor on telemetry. 2. QTc prolongation-QTC is prolonged to 523, will check serum magnesium level.  Monitor closely on telemetry.  Repeat EKG in a.m. 3. Acute kidney injury-likely cardiorenal syndrome,  creatinine is 1.68.  Baseline creatinine 0.94 as of 10/01/2020.  Follow BMP in am.  Patient was given Lasix 60 mg IV in the ED. 4. History of right upper lung nodule-presumed non-small cell lung cancer, s/p radiation  treatment 4 months ago.  Patient has lost to follow-up with oncology and has not been compliant with CT scans as ordered.  He will follow up with Dr. Marin Olp as outpatient. 5. Hypertension-continue Protonix, will hold Cozaar due to worsening renal function.  If renal function improves tomorrow morning consider restarting Cozaar.   DVT Prophylaxis-   Lovenox   AM Labs Ordered, also please review Full Orders  Family Communication: Admission, patients condition and plan of care including tests being ordered have been discussed with the patient  who indicate understanding and agree with the plan and Code Status.  Code Status: Full code  Admission status: Inpatient :The appropriate admission status for this patient is INPATIENT. Inpatient status is judged to be reasonable and necessary in order to provide the required intensity of service to ensure the patient's safety. The patient's presenting symptoms, physical exam findings, and initial radiographic and laboratory data in the context of their chronic comorbidities is felt to place them at high risk for further clinical deterioration. Furthermore, it is not anticipated that the patient will be medically stable for discharge from the hospital within 2 midnights of admission. The following factors support the admission status of inpatient.     The patient's presenting symptoms include shortness of breath The worrisome physical exam findings include none. The initial radiographic and laboratory data are worrisome because of elevated BNP The chronic co-morbidities include lung mass       * I certify that at the point of admission it is my clinical judgment that the patient will require inpatient hospital care spanning beyond 2 midnights from the point of admission due to high intensity of service, high risk for further deterioration and high frequency of surveillance required.*  Time spent in minutes : 60 minutes   molsidomine  understandable   [place] M.D

## 2020-10-25 NOTE — ED Notes (Signed)
Pulse ox read between 65-72 when he first started walking, seems to have difficulty breathing while walking, after walking around room several times pulse ox jump up to 101

## 2020-10-25 NOTE — ED Triage Notes (Signed)
Patient reports to the ER for congestive heart failure and possible pneumonia. Patient reports he was told by his PCP to come be admitted to hospital.

## 2020-10-26 ENCOUNTER — Inpatient Hospital Stay (HOSPITAL_COMMUNITY): Payer: Medicare Other

## 2020-10-26 ENCOUNTER — Other Ambulatory Visit: Payer: Self-pay

## 2020-10-26 DIAGNOSIS — I428 Other cardiomyopathies: Secondary | ICD-10-CM

## 2020-10-26 DIAGNOSIS — I340 Nonrheumatic mitral (valve) insufficiency: Secondary | ICD-10-CM

## 2020-10-26 DIAGNOSIS — I351 Nonrheumatic aortic (valve) insufficiency: Secondary | ICD-10-CM | POA: Diagnosis not present

## 2020-10-26 DIAGNOSIS — I5043 Acute on chronic combined systolic (congestive) and diastolic (congestive) heart failure: Secondary | ICD-10-CM | POA: Diagnosis not present

## 2020-10-26 DIAGNOSIS — R7989 Other specified abnormal findings of blood chemistry: Secondary | ICD-10-CM | POA: Diagnosis present

## 2020-10-26 DIAGNOSIS — I361 Nonrheumatic tricuspid (valve) insufficiency: Secondary | ICD-10-CM

## 2020-10-26 DIAGNOSIS — R945 Abnormal results of liver function studies: Secondary | ICD-10-CM | POA: Diagnosis present

## 2020-10-26 LAB — HEPATIC FUNCTION PANEL
ALT: 1828 U/L — ABNORMAL HIGH (ref 0–44)
AST: 1957 U/L — ABNORMAL HIGH (ref 15–41)
Albumin: 3.5 g/dL (ref 3.5–5.0)
Alkaline Phosphatase: 162 U/L — ABNORMAL HIGH (ref 38–126)
Bilirubin, Direct: 1 mg/dL — ABNORMAL HIGH (ref 0.0–0.2)
Indirect Bilirubin: 2.9 mg/dL — ABNORMAL HIGH (ref 0.3–0.9)
Total Bilirubin: 3.9 mg/dL — ABNORMAL HIGH (ref 0.3–1.2)
Total Protein: 6.9 g/dL (ref 6.5–8.1)

## 2020-10-26 LAB — CBC
HCT: 35.9 % — ABNORMAL LOW (ref 39.0–52.0)
Hemoglobin: 11.9 g/dL — ABNORMAL LOW (ref 13.0–17.0)
MCH: 28.8 pg (ref 26.0–34.0)
MCHC: 33.1 g/dL (ref 30.0–36.0)
MCV: 86.9 fL (ref 80.0–100.0)
Platelets: 265 10*3/uL (ref 150–400)
RBC: 4.13 MIL/uL — ABNORMAL LOW (ref 4.22–5.81)
RDW: 15.2 % (ref 11.5–15.5)
WBC: 9.8 10*3/uL (ref 4.0–10.5)
nRBC: 0.3 % — ABNORMAL HIGH (ref 0.0–0.2)

## 2020-10-26 LAB — BLOOD GAS, ARTERIAL
Bicarbonate: 24.2 mmol/L (ref 20.0–28.0)
Drawn by: 336832
O2 Content: 2 L/min
O2 Saturation: 94.6 %
Patient temperature: 98.6
pCO2 arterial: 31.5 mmHg — ABNORMAL LOW (ref 32.0–48.0)
pH, Arterial: 7.5 — ABNORMAL HIGH (ref 7.350–7.450)
pO2, Arterial: 82.9 mmHg — ABNORMAL LOW (ref 83.0–108.0)

## 2020-10-26 LAB — RAPID URINE DRUG SCREEN, HOSP PERFORMED
Amphetamines: POSITIVE — AB
Barbiturates: NOT DETECTED
Benzodiazepines: NOT DETECTED
Cocaine: NOT DETECTED
Opiates: NOT DETECTED
Tetrahydrocannabinol: POSITIVE — AB

## 2020-10-26 LAB — COMPREHENSIVE METABOLIC PANEL
ALT: 1898 U/L — ABNORMAL HIGH (ref 0–44)
AST: 2419 U/L — ABNORMAL HIGH (ref 15–41)
Albumin: 3.3 g/dL — ABNORMAL LOW (ref 3.5–5.0)
Alkaline Phosphatase: 154 U/L — ABNORMAL HIGH (ref 38–126)
Anion gap: 15 (ref 5–15)
BUN: 45 mg/dL — ABNORMAL HIGH (ref 8–23)
CO2: 22 mmol/L (ref 22–32)
Calcium: 8.3 mg/dL — ABNORMAL LOW (ref 8.9–10.3)
Chloride: 97 mmol/L — ABNORMAL LOW (ref 98–111)
Creatinine, Ser: 1.27 mg/dL — ABNORMAL HIGH (ref 0.61–1.24)
GFR, Estimated: >60 mL/min (ref >60)
Glucose, Bld: 80 mg/dL (ref 70–99)
Potassium: 3.6 mmol/L (ref 3.5–5.1)
Sodium: 134 mmol/L — ABNORMAL LOW (ref 135–145)
Total Bilirubin: 3.6 mg/dL — ABNORMAL HIGH (ref 0.3–1.2)
Total Protein: 6.4 g/dL — ABNORMAL LOW (ref 6.5–8.1)

## 2020-10-26 LAB — PROTIME-INR
INR: 1.9 — ABNORMAL HIGH (ref 0.8–1.2)
Prothrombin Time: 21.2 seconds — ABNORMAL HIGH (ref 11.4–15.2)

## 2020-10-26 LAB — ECHOCARDIOGRAM COMPLETE
Area-P 1/2: 7.29 cm2
MV M vel: 4.61 m/s
MV Peak grad: 85 mmHg
P 1/2 time: 332 msec
Radius: 0.8 cm
S' Lateral: 6.4 cm
Weight: 3008.84 oz

## 2020-10-26 LAB — HEPATITIS PANEL, ACUTE
HCV Ab: NONREACTIVE
Hep A IgM: NONREACTIVE
Hep B C IgM: NONREACTIVE
Hepatitis B Surface Ag: NONREACTIVE

## 2020-10-26 LAB — AMMONIA: Ammonia: 13 umol/L (ref 9–35)

## 2020-10-26 MED ORDER — FUROSEMIDE 10 MG/ML IJ SOLN
40.0000 mg | Freq: Two times a day (BID) | INTRAMUSCULAR | Status: DC
  Administered 2020-10-26 – 2020-10-28 (×4): 40 mg via INTRAVENOUS
  Filled 2020-10-26 (×5): qty 4

## 2020-10-26 MED ORDER — PNEUMOCOCCAL VAC POLYVALENT 25 MCG/0.5ML IJ INJ
0.5000 mL | INJECTION | INTRAMUSCULAR | Status: AC
  Administered 2020-10-28: 0.5 mL via INTRAMUSCULAR
  Filled 2020-10-26: qty 0.5

## 2020-10-26 MED ORDER — LORAZEPAM 0.5 MG PO TABS
0.5000 mg | ORAL_TABLET | Freq: Four times a day (QID) | ORAL | Status: DC | PRN
  Administered 2020-10-26 – 2020-10-29 (×5): 0.5 mg via ORAL
  Filled 2020-10-26 (×5): qty 1

## 2020-10-26 NOTE — Progress Notes (Signed)
   10/26/20 1242  Assess: MEWS Score  Temp 97.8 F (36.6 C)  BP 106/83  Pulse Rate (!) 109  Resp (!) 26  SpO2 99 %  O2 Device Nasal Cannula  O2 Flow Rate (L/min) 2 L/min  Assess: MEWS Score  MEWS Temp 0  MEWS Systolic 0  MEWS Pulse 1  MEWS RR 2  MEWS LOC 0  MEWS Score 3  MEWS Score Color Yellow  Assess: if the MEWS score is Yellow or Red  Were vital signs taken at a resting state? Yes  Focused Assessment No change from prior assessment  Early Detection of Sepsis Score *See Row Information* Low  MEWS guidelines implemented *See Row Information* Yes  Treat  MEWS Interventions Administered scheduled meds/treatments  Pain Scale 0-10  Pain Score 0  Take Vital Signs  Increase Vital Sign Frequency  Yellow: Q 2hr X 2 then Q 4hr X 2, if remains yellow, continue Q 4hrs  Escalate  MEWS: Escalate Yellow: discuss with charge nurse/RN and consider discussing with provider and RRT  Notify: Charge Nurse/RN  Name of Charge Nurse/RN Notified Brien Mates, RN  Date Charge Nurse/RN Notified 10/26/20  Time Charge Nurse/RN Notified [place] notified. Supplemental O2 applied. Will continue to monitor.

## 2020-10-26 NOTE — Consult Note (Signed)
Cardiology Consultation:   Patient ID: Philip Campbell MRN: [id number]; DOB: 1951-03-14  Admit date: 10/25/2020 Date of Consult: 10/26/2020  Primary Care Provider: Libby Maw, [facility] Cardiologist: Philip Campbell  [place] Electrophysiologist:  None   Patient Profile:   Philip Campbell is a 70 y.o. male with a hx of chronic systolic and diastolic heart failure, nonischemic cardiomyopathy, lung mass s/p radiation who is being seen today for the evaluation of acute heart failure at the request of Philip Campbell.  History of Present Illness:   Philip Campbell reports a several month history of progressive shortness of breath. He was last seen by Philip Campbell in 06/2020.  He has been weighing himself daily, with very little fluctuation on his home scale, around mid-160s lbs. He denies any edema. He has felt progressively more short of breath and weak over the last few months. He has been compliant with his lasix. He has been unable to get cMRI due to cost.  Denies fevers, chills, sputum. No PND or orthopnea. No LE edema. No syncope.   He denies history of OSA, but during echo today had almost 1 minute long period of apnea.   Past Medical History:  Diagnosis Date  . Chronic systolic CHF (congestive heart failure) ([place])   . Goals of care, counseling/discussion 04/23/2020    Past Surgical History:  Procedure Laterality Date  . ELBOW BURSA SURGERY    . GALLBLADDER SURGERY    . HERNIA REPAIR       Home Medications:  Prior to Admission medications   Medication Sig Start Date End Date Taking? Authorizing Provider  atorvastatin (LIPITOR) 20 MG tablet Take 1 tablet (20 mg total) by mouth daily. 10/01/20  Yes Philip Campbell  furosemide (LASIX) 20 MG tablet Take 1 tablet (20 mg total) by mouth daily. 10/03/20  Yes Philip Campbell  MAGNESIUM PO Take 1 tablet by mouth daily.   Yes Provider, Historical, Campbell  metoprolol succinate (TOPROL-XL) 25 MG  24 hr tablet Take 1 tablet (25 mg total) by mouth daily. 10/01/20  Yes Philip Campbell  Multiple Vitamins-Minerals (ZINC PO) Take 1 tablet by mouth daily.   Yes Provider, Historical, Campbell  Multiple Vitamins-Minerals (ZINC PO) Take 1 tablet by mouth daily.   Yes Provider, Historical, Campbell  pantoprazole (PROTONIX) 40 MG tablet Take 1 tablet (40 mg total) by mouth daily. 08/05/20  Yes Philip Campbell    Inpatient Medications: Scheduled Meds: . enoxaparin (LOVENOX) injection  40 mg Subcutaneous Q24H  . furosemide  40 mg Intravenous BID  . metoprolol succinate  25 mg Oral Daily  . pantoprazole  40 mg Oral Daily  . [START ON 10/27/2020] pneumococcal 23 valent vaccine  0.5 mL Intramuscular Tomorrow-1000  . sodium chloride flush  3 mL Intravenous Q12H   Continuous Infusions: . sodium chloride     PRN Meds: sodium chloride, sodium chloride flush  Allergies:   No Known Allergies  Social History:   Social History   Socioeconomic History  . Marital status: Single    Spouse name: Not on file  . Number of children: Not on file  . Years of education: Not on file  . Highest education level: Not on file  Occupational History  . Not on file  Tobacco Use  . Smoking status: Never Smoker  . Smokeless tobacco: Never Used  Vaping Use  . Vaping Use: Never used  Substance and Sexual Activity  . Alcohol  use: Never  . Drug use: Never  . Sexual activity: Not on file  Other Topics Concern  . Not on file  Social History Narrative  . Not on file   Social Determinants of Health   Financial Resource Strain: Not on file  Food Insecurity: Not on file  Transportation Needs: Not on file  Physical Activity: Not on file  Stress: Not on file  Social Connections: Not on file  Intimate Partner Violence: Not on file    Family History:    Family History  Problem Relation Age of Onset  . CAD Mother        CABG x3 in her 38's  . Alcoholism Mother   . Heart failure Neg Hx      ROS:   Please see the history of present illness.  Constitutional: Negative for chills, fever, night sweats, unintentional weight loss  HENT: Negative for ear pain and hearing loss.   Eyes: Negative for loss of vision and eye pain.  Respiratory: Negative for cough, sputum, wheezing.  Positive for shortness of breath. Cardiovascular: See HPI. Gastrointestinal: Negative for abdominal pain, melena, and hematochezia.  Genitourinary: Negative for dysuria and hematuria.  Musculoskeletal: Negative for falls and myalgias.  Skin: Negative for itching and rash.  Neurological: Negative for focal weakness, focal sensory changes and loss of consciousness.  Endo/Heme/Allergies: Does not bruise/bleed easily.  All other ROS reviewed and negative.     Physical Exam/Data:   Vitals:   10/26/20 0445 10/26/20 0511 10/26/20 0752 10/26/20 0912  BP: 121/75  109/83 107/85  Pulse: 95  93 92  Resp: (!) 22 20 (!) 22 (!) 21  Temp: 97.6 F (36.4 C)  97.7 F (36.5 C) 97.8 F (36.6 C)  TempSrc: Oral  Oral Oral  SpO2: 97%  95% 95%  Weight:  85.3 kg      Intake/Output Summary (Last 24 hours) at 10/26/2020 1129 Last data filed at 10/26/2020 1124 Gross per 24 hour  Intake 480 ml  Output 1825 ml  Net -1345 ml   Last 3 Weights 10/26/2020 10/20/2020 10/01/2020  Weight (lbs) 188 lb 0.8 oz 170 lb 3.2 oz 171 lb  Weight (kg) 85.3 kg 77.202 kg 77.565 kg     Body mass index is 26.23 kg/m.  General:  Well nourished, but conversationally dyspneic. Saturating well on room air. HEENT: normal Neck: JVD just at clavicle at 45 degrees Endocrine:  No thryomegaly Vascular: No carotid bruits; RA pulses 2+ bilaterally Cardiac:  normal S1, S2; RRR; 2/6 holosystolic murmur Lungs:  clear to auscultation bilaterally, no wheezing, rhonchi or rales  Abd: soft, nontender, no hepatomegaly  Ext: no edema. Warm, well perfused Musculoskeletal:  No deformities, moves all 4 limbs independently Skin: warm and dry  Neuro:  no focal  abnormalities noted Psych:  Normal affect   EKG:  The EKG was personally reviewed and demonstrates:  SR, LBBB, occasional PVCs Telemetry:  Telemetry was personally reviewed and demonstrates:  SR, LBBB, occasional PVCs  Relevant CV Studies: Echo 10/26/20 1. Left ventricular ejection fraction, by estimation, is <20%. The left  ventricle has severely decreased function. The left ventricle demonstrates  global hypokinesis. The left ventricular internal cavity size was severely  dilated. Left ventricular  diastolic parameters are consistent with Grade II diastolic dysfunction  (pseudonormalization). Elevated left atrial pressure.  2. Right ventricular systolic function is normal. The right ventricular  size is mildly enlarged. There is moderately elevated pulmonary artery  systolic pressure.  3. Left atrial size was  severely dilated.  4. The mitral valve is normal in structure. Moderate to severe mitral  valve regurgitation. No evidence of mitral stenosis.  5. Tricuspid valve regurgitation is mild to moderate.  6. The aortic valve is tricuspid. Aortic valve regurgitation is mild.  Mild aortic valve sclerosis is present, with no evidence of aortic valve  stenosis.  7. Aortic dilatation noted. There is mild dilatation of the aortic root,  measuring 43 mm. There is mild dilatation of the ascending aorta,  measuring 44 mm.  8. The inferior vena cava is dilated in size with <50% respiratory  variability, suggesting right atrial pressure of 15 mmHg.   Laboratory Data:  High Sensitivity Troponin:  No results for input(s): TROPONINIHS in the last 720 hours.   Chemistry Recent Labs  Lab 10/25/20 1247 10/26/20 0438  NA 133* 134*  K 4.3 3.6  CL 97* 97*  CO2 19* 22  GLUCOSE 157* 80  BUN 46* 45*  CREATININE 1.68* 1.27*  CALCIUM 8.7* 8.3*  GFRNONAA 43* >60  ANIONGAP 17* 15    Recent Labs  Lab 10/26/20 0438 10/26/20 1006  PROT 6.4* 6.9  ALBUMIN 3.3* 3.5  AST 2,419* 1,957*   ALT 1,898* 1,828*  ALKPHOS 154* 162*  BILITOT 3.6* 3.9*   Hematology Recent Labs  Lab 10/25/20 1247 10/26/20 0438  WBC 10.9* 9.8  RBC 4.00* 4.13*  HGB 11.4* 11.9*  HCT 36.1* 35.9*  MCV 90.3 86.9  MCH 28.5 28.8  MCHC 31.6 33.1  RDW 15.1 15.2  PLT 278 265   BNP Recent Labs  Lab 10/25/20 1335  BNP 2,346.9*    DDimer No results for input(s): DDIMER in the last 168 hours.   Radiology/Studies:  DG Chest 2 View  Result Date: 10/25/2020 CLINICAL DATA:  Heart from earlier, shortness of breath. EXAM: CHEST - 2 VIEW COMPARISON:  Mar 19, 2020 FINDINGS: The mediastinal contour is normal. Heart size is enlarged. Right upper lobe mass is more prominent compared to prior chest x-ray. Increased pulmonary interstitium is identified in the left lung. IMPRESSION: 1. Right upper lobe mass is more prominent compared to prior chest x-ray. 2. Mild congestive heart failure. Electronically Signed   By: Abelardo Diesel M.D.   On: 10/25/2020 12:44   CT Angio Chest PE W and/or Wo Contrast  Result Date: 10/25/2020 CLINICAL DATA:  70 year old male with suspected pulmonary embolism. Congestive heart failure and possible pneumonia. Evaluate for pulmonary embolism. EXAM: CT ANGIOGRAPHY CHEST WITH CONTRAST TECHNIQUE: Multidetector CT imaging of the chest was performed using the standard protocol during bolus administration of intravenous contrast. Multiplanar CT image reconstructions and MIPs were obtained to evaluate the vascular anatomy. CONTRAST:  49m OMNIPAQUE IOHEXOL 350 MG/ML SOLN COMPARISON:  Chest CT 01/23/2020. FINDINGS: Comment: Today's study is limited by extensive patient respiratory motion. Cardiovascular: No definite central or lobar filling defects to suggest large pulmonary embolism. Unfortunately, smaller segmental and subsegmental sized of filling defects cannot be entirely excluded on the basis of today's motion limited examination. Heart size is mildly enlarged. There is no significant pericardial  fluid, thickening or pericardial calcification. There is aortic atherosclerosis, as well as atherosclerosis of the great vessels of the mediastinum and the coronary arteries, including calcified atherosclerotic plaque in the left main, left anterior descending and left circumflex coronary arteries. Mild calcifications of the aortic valve. Mediastinum/Nodes: No pathologically enlarged mediastinal or hilar lymph nodes. Esophagus is unremarkable in appearance. No axillary lymphadenopathy. Lungs/Pleura: Previously noted right upper lobe pulmonary nodule is again noted present measuring  approximately 2.7 x 2.2 cm, with new areas of surrounding architectural distortion, volume loss, ground-glass attenuation and septal thickening with extensive architectural distortion, most compatible with evolving postradiation changes. No pleural effusions. Upper Abdomen: Aortic atherosclerosis.  Status post cholecystectomy. Musculoskeletal: There are no aggressive appearing lytic or blastic lesions noted in the visualized portions of the skeleton. Review of the MIP images confirms the above findings. IMPRESSION: 1. Severely limited examination demonstrating no central or lobar sized pulmonary embolism. Smaller segmental and subsegmental sized be entirely excluded pulmonary emboli cannot on the basis of today's examination. 2. Treatment related changes surrounding patient's right upper lobe lung cancer, presumably from prior radiation therapy, as above. 3. Mild cardiomegaly. 4. Aortic atherosclerosis, in addition to left main and 2 vessel coronary artery disease. Assessment for potential risk factor modification, dietary therapy or pharmacologic therapy may be warranted, if clinically indicated. 5. There are calcifications of the aortic valve. Echocardiographic correlation for evaluation of potential valvular dysfunction may be warranted if clinically indicated. Aortic Atherosclerosis (ICD10-I70.0). Electronically Signed   By: Vinnie Langton M.D.   On: 10/25/2020 15:31   ECHOCARDIOGRAM COMPLETE  Result Date: 10/26/2020    ECHOCARDIOGRAM REPORT   Patient Name:   Philip Campbell Date of Exam: 10/26/2020 Medical Rec #:  [id number]        Height:       71.0 in Accession #:    [id number]       Weight:       188.1 lb Date of Birth:  January 08, 1951       BSA:          2.054 m Patient Age:    4 years         BP:           107/85 mmHg Patient Gender: M                HR:           92 bpm. Exam Location:  Inpatient Procedure: 2D Echo, 3D Echo, Color Doppler and Cardiac Doppler Indications:     Cardiomyopathy-Unspecified I42.9  History:         Patient has prior history of Echocardiogram examinations, most                  recent 01/24/2020. CHF; Arrythmias:Tachycardia. Lung cancer.  Sonographer:     Darlina Sicilian RDCS Referring Phys:  [id number] Barb Merino Diagnosing Phys: Kirk Ruths Campbell IMPRESSIONS  1. Left ventricular ejection fraction, by estimation, is <20%. The left ventricle has severely decreased function. The left ventricle demonstrates global hypokinesis. The left ventricular internal cavity size was severely dilated. Left ventricular diastolic parameters are consistent with Grade II diastolic dysfunction (pseudonormalization). Elevated left atrial pressure.  2. Right ventricular systolic function is normal. The right ventricular size is mildly enlarged. There is moderately elevated pulmonary artery systolic pressure.  3. Left atrial size was severely dilated.  4. The mitral valve is normal in structure. Moderate to severe mitral valve regurgitation. No evidence of mitral stenosis.  5. Tricuspid valve regurgitation is mild to moderate.  6. The aortic valve is tricuspid. Aortic valve regurgitation is mild. Mild aortic valve sclerosis is present, with no evidence of aortic valve stenosis.  7. Aortic dilatation noted. There is mild dilatation of the aortic root, measuring 43 mm. There is mild dilatation of the ascending aorta, measuring 44 mm.  8.  The inferior vena cava is dilated in size with <50% respiratory variability, suggesting right atrial  pressure of 15 mmHg. FINDINGS  Left Ventricle: Left ventricular ejection fraction, by estimation, is <20%. The left ventricle has severely decreased function. The left ventricle demonstrates global hypokinesis. The left ventricular internal cavity size was severely dilated. There is no left ventricular hypertrophy. Left ventricular diastolic parameters are consistent with Grade II diastolic dysfunction (pseudonormalization). Elevated left atrial pressure. Right Ventricle: The right ventricular size is mildly enlarged. Right ventricular systolic function is normal. There is moderately elevated pulmonary artery systolic pressure. The tricuspid regurgitant velocity is 3.01 m/s, and with an assumed right atrial pressure of 15 mmHg, the estimated right ventricular systolic pressure is 95.6 mmHg. Left Atrium: Left atrial size was severely dilated. Right Atrium: Right atrial size was normal in size. Pericardium: There is no evidence of pericardial effusion. Mitral Valve: The mitral valve is normal in structure. Moderate to severe mitral valve regurgitation. No evidence of mitral valve stenosis. MV peak gradient, 7.2 mmHg. The mean mitral valve gradient is 4.0 mmHg. Tricuspid Valve: The tricuspid valve is normal in structure. Tricuspid valve regurgitation is mild to moderate. No evidence of tricuspid stenosis. Aortic Valve: The aortic valve is tricuspid. Aortic valve regurgitation is mild. Aortic regurgitation PHT measures 332 msec. Mild aortic valve sclerosis is present, with no evidence of aortic valve stenosis. Pulmonic Valve: The pulmonic valve was normal in structure. Pulmonic valve regurgitation is not visualized. No evidence of pulmonic stenosis. Aorta: Aortic dilatation noted. There is mild dilatation of the aortic root, measuring 43 mm. There is mild dilatation of the ascending aorta, measuring 44 mm. Venous: The  inferior vena cava is dilated in size with less than 50% respiratory variability, suggesting right atrial pressure of 15 mmHg.   LEFT VENTRICLE PLAX 2D LVIDd:         7.00 cm  Diastology LVIDs:         6.40 cm  LV e' medial:    3.30 cm/s LV PW:         0.90 cm  LV E/e' medial:  36.1 LV IVS:        0.80 cm  LV e' lateral:   4.30 cm/s LVOT diam:     2.30 cm  LV E/e' lateral: 27.7 LV SV:         32 LV SV Index:   15 LVOT Area:     4.15 cm                          3D Volume EF:                         3D EF:        12 %                         LV EDV:       323 ml                         LV ESV:       285 ml                         LV SV:        39 ml RIGHT VENTRICLE RV S prime:     13.80 cm/s TAPSE (M-mode): 2.2 cm LEFT ATRIUM              Index  RIGHT ATRIUM           Index LA diam:        4.20 cm  2.04 cm/m  RA Area:     19.00 cm LA Vol (A2C):   113.0 ml 55.01 ml/m RA Volume:   56.00 ml  27.26 ml/m LA Vol (A4C):   107.0 ml 52.09 ml/m LA Biplane Vol: 111.0 ml 54.04 ml/m  AORTIC VALVE LVOT Vmax:   57.00 cm/s LVOT Vmean:  37.500 cm/s LVOT VTI:    0.076 m AI PHT:      332 msec  AORTA Ao Root diam: 4.30 cm Ao Asc diam:  4.40 cm MITRAL VALVE                 TRICUSPID VALVE MV Area (PHT): 7.29 cm      TR Peak grad:   36.2 mmHg MV Peak grad:  7.2 mmHg      TR Vmax:        301.00 cm/s MV Mean grad:  4.0 mmHg MV Vmax:       1.34 m/s      SHUNTS MV Vmean:      91.1 cm/s     Systemic VTI:  0.08 m MV Decel Time: 104 msec      Systemic Diam: 2.30 cm MR Peak grad:    85.0 mmHg MR Mean grad:    53.5 mmHg MR Vmax:         461.00 cm/s MR Vmean:        344.0 cm/s MR PISA:         4.02 cm MR PISA Eff ROA: 15 mm MR PISA Radius:  0.80 cm MV E velocity: 119.00 cm/s MV A velocity: 91.20 cm/s MV E/A ratio:  1.30 Olga Millers Campbell Electronically signed by Olga Millers Campbell Signature Date/Time: 10/26/2020/11:01:51 AM    Final      Assessment and Plan:   Acute on chronic systolic and diastolic heart failure Known EF  ~20% -nonobstructive CAD on cath at [facility], consistent with nonischemic cardiomyopathy -had restrictive filling (grade 3 diastolic dysfunction) on last echo, as well as decreased RV function -has known moderate-severe MR, likely complicates volume status -has not tolerated ACEi, Bblocker due to hypotension in the past, but has been able to gradually tolerate as an outpatient -recommended for cMRI, but has been unable to afford -BNP on presentation elevated to 2346. 3 weeks ago was 563. -admission weight not charted, weight today 85.3 kg -reported as net negative 1.1 L -echo today with severely dilated LV, EF <20%, grade 2 DD, elevated PASP, moderate to severe MR -his lungs are surprisingly clear today, without elevated in JVD. He is warm and well perfused. There is mild reflux in the hepatic veins. I cannot explain his elevated LFTs by congestion alone. -continue metoprolol succinate -ARB on hold given AKI -ok to continue gentle diuresis, but his shortness of breath seems out of proportion. He did have witness apnea during his echo, but no prior known OSA.  New York Heart Association (NYHA) Functional Class NYHA Class IV   Acute kidney injury -prior Cr 0.94 with GFR >60. On admission, Cr 1.68 with GFR 43.  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 -defer to primary team  For questions or updates, please contact Ranger Please consult [email or web address] for contact info under    Signed, Buford Dresser, Campbell  10/26/2020 11:29 AM

## 2020-10-26 NOTE — Progress Notes (Signed)
SATURATION QUALIFICATIONS: (This note is used to comply with regulatory documentation for home oxygen)  Patient Saturations on Room Air at Rest = 95%  Patient Saturations on Room Air while Ambulating = 97%  Patient Saturations on 0 Liters of oxygen while Ambulating = 97%  Please briefly explain why patient needs home oxygen: No O2 indicated

## 2020-10-26 NOTE — Progress Notes (Signed)
PROGRESS NOTE    Philip Campbell  UDJ:[id number] DOB: Sep 11, 1951 DOA: 10/25/2020 PCP: Libby Maw, MD    Brief Narrative:  70 year old gentleman with history of chronic systolic congestive heart failure with known ejection fraction less than 20%, nonischemic cardiomyopathy, right upper lobe lung cancer resume non-small cell lung cancer status post radiation treatment and post radiation changes, recently lost follow-up presented to the ER with progressive shortness of breath for at least last 2 months.  In the emergency room BNP 2346.  Chest x-ray mild congestive heart failure.  Admitted with multifactorial shortness of breath.   Assessment & Plan:   Active Problems:   CHF exacerbation (HCC)  Shortness of breath, multifactorial.  This is probably due to combination of nonischemic cardiomyopathy, valvular heart disease, lung cancer and radiation induced lung damage.  Treating for individual causes.  Keep on oxygen to keep saturations more than 90%.  Ambulate and monitor need for oxygen at home.  Presumed acute on chronic systolic heart failure, known nonischemic cardiomyopathy: Repeat echo today with ejection fraction less than 20% patient with no peripheral edema or fluid overload. Consulted and discussed case with cardiology. We will continue IV diuresis, intake and output monitoring and renal function monitoring. On beta-blockers. Intolerance to ACE or ARB. Poor prognosis with very low ejection fraction, palliative care consultation.  Lung cancer: Follows up with oncology as outpatient.  Abnormal LFT: Patient was found with greatly elevated transaminases today.  Repeat testing confirms the diagnosis, not worsening on repeat testing.  Previous known normal LFTs. Check acute hepatitis panel, pending Sent for urine drug screen. Right upper quadrant ultrasound, no abnormalities. Patient has no hypotensive episodes, not clearly explained by hepatic congestion secondary to  heart failure, however currently stable so continue to monitor. Daily INR, ammonia levels.  Acute kidney injury: Probably due to cardiorenal syndrome.  Stabilizing.  Continue IV diuresis and monitor levels.     DVT prophylaxis: enoxaparin (LOVENOX) injection 40 mg Start: 10/25/20 2200   Code Status: Full code Family Communication: None Disposition Plan: Status is: Inpatient  Remains inpatient appropriate because:Inpatient level of care appropriate due to severity of illness   Dispo: The patient is from: Home              Anticipated d/c is to: Home              Anticipated d/c date is: 3 days              Patient currently is not medically stable to d/c.   Consultants:   Cardiology  Palliative medicine  Procedures:   None  Antimicrobials:   None   Subjective: Patient seen and examined.  At rest he feels okay.  When he mobilizes loses breath.  Never has leg edema.  Denies any cough or fever.  Objective: Vitals:   10/26/20 0511 10/26/20 0752 10/26/20 0912 10/26/20 1242  BP:  109/83 107/85 106/83  Pulse:  93 92 (!) 109  Resp: 20 (!) 22 (!) 21 (!) 26  Temp:  97.7 F (36.5 C) 97.8 F (36.6 C) 97.8 F (36.6 C)  TempSrc:  Oral Oral Oral  SpO2:  95% 95% 99%  Weight: 85.3 kg       Intake/Output Summary (Last 24 hours) at 10/26/2020 1327 Last data filed at 10/26/2020 1124 Gross per 24 hour  Intake 480 ml  Output 1825 ml  Net -1345 ml   Filed Weights   10/26/20 0511  Weight: 85.3 kg    Examination:  General exam: Appears calm and comfortable at rest. Chronically sick looking Respiratory system: Clear to auscultation. Respiratory effort normal. Cardiovascular system: S1 & S2 heard, RRR.  Tachycardic.  No edema.  No JVD.  Gastrointestinal system: Abdomen is nondistended, soft and nontender. No organomegaly or masses felt. Normal bowel sounds heard. Central nervous system: Alert and oriented. No focal neurological deficits. Extremities: Symmetric 5 x 5  power. Skin: No rashes, lesions or ulcers Psychiatry: Judgement and insight appear normal. Mood & affect flat.    Data Reviewed: I have personally reviewed following labs and imaging studies  CBC: Recent Labs  Lab 10/25/20 1247 10/26/20 0438  WBC 10.9* 9.8  HGB 11.4* 11.9*  HCT 36.1* 35.9*  MCV 90.3 86.9  PLT 278 419   Basic Metabolic Panel: Recent Labs  Lab 10/25/20 1247 10/26/20 0438  NA 133* 134*  K 4.3 3.6  CL 97* 97*  CO2 19* 22  GLUCOSE 157* 80  BUN 46* 45*  CREATININE 1.68* 1.27*  CALCIUM 8.7* 8.3*  MG 1.9  --    GFR: Estimated Creatinine Clearance: 56.8 mL/min (A) (by C-G formula based on SCr of 1.27 mg/dL (H)). Liver Function Tests: Recent Labs  Lab 10/26/20 0438 10/26/20 1006  AST 2,419* 1,957*  ALT 1,898* 1,828*  ALKPHOS 154* 162*  BILITOT 3.6* 3.9*  PROT 6.4* 6.9  ALBUMIN 3.3* 3.5   No results for input(s): LIPASE, AMYLASE in the last 168 hours. No results for input(s): AMMONIA in the last 168 hours. Coagulation Profile: Recent Labs  Lab 10/25/20 1247 10/26/20 1006  INR 2.2* 1.9*   Cardiac Enzymes: No results for input(s): CKTOTAL, CKMB, CKMBINDEX, TROPONINI in the last 168 hours. BNP (last 3 results) Recent Labs    10/01/20 1441  PROBNP 563.0*   HbA1C: No results for input(s): HGBA1C in the last 72 hours. CBG: No results for input(s): GLUCAP in the last 168 hours. Lipid Profile: No results for input(s): CHOL, HDL, LDLCALC, TRIG, CHOLHDL, LDLDIRECT in the last 72 hours. Thyroid Function Tests: No results for input(s): TSH, T4TOTAL, FREET4, T3FREE, THYROIDAB in the last 72 hours. Anemia Panel: No results for input(s): VITAMINB12, FOLATE, FERRITIN, TIBC, IRON, RETICCTPCT in the last 72 hours. Sepsis Labs: No results for input(s): PROCALCITON, LATICACIDVEN in the last 168 hours.  Recent Results (from the past 240 hour(s))  Resp Panel by RT-PCR (Flu A&B, Covid) Nasopharyngeal Swab     Status: None   Collection Time: 10/25/20   2:20 PM   Specimen: Nasopharyngeal Swab; Nasopharyngeal(NP) swabs in vial transport medium  Result Value Ref Range Status   SARS Coronavirus 2 by RT PCR NEGATIVE NEGATIVE Final    Comment: (NOTE) SARS-CoV-2 target nucleic acids are NOT DETECTED.  The SARS-CoV-2 RNA is generally detectable in upper respiratory specimens during the acute phase of infection. The lowest concentration of SARS-CoV-2 viral copies this assay can detect is 138 copies/mL. A negative result does not preclude SARS-Cov-2 infection and should not be used as the sole basis for treatment or other patient management decisions. A negative result may occur with  improper specimen collection/handling, submission of specimen other than nasopharyngeal swab, presence of viral mutation(s) within the areas targeted by this assay, and inadequate number of viral copies(<138 copies/mL). A negative result must be combined with clinical observations, patient history, and epidemiological information. The expected result is Negative.  Fact Sheet for Patients:  EntrepreneurPulse.com.au  Fact Sheet for Healthcare Providers:  IncredibleEmployment.be  This test is no t yet approved or cleared by the [place]  States FDA and  has been authorized for detection and/or diagnosis of SARS-CoV-2 by FDA under an Emergency Use Authorization (EUA). This EUA will remain  in effect (meaning this test can be used) for the duration of the COVID-19 declaration under Section 564(b)(1) of the Act, 21 U.S.C.section 360bbb-3(b)(1), unless the authorization is terminated  or revoked sooner.       Influenza A by PCR NEGATIVE NEGATIVE Final   Influenza B by PCR NEGATIVE NEGATIVE Final    Comment: (NOTE) The Xpert Xpress SARS-CoV-2/FLU/RSV plus assay is intended as an aid in the diagnosis of influenza from Nasopharyngeal swab specimens and should not be used as a sole basis for treatment. Nasal washings and aspirates  are unacceptable for Xpert Xpress SARS-CoV-2/FLU/RSV testing.  Fact Sheet for Patients: EntrepreneurPulse.com.au  Fact Sheet for Healthcare Providers: IncredibleEmployment.be  This test is not yet approved or cleared by the [place] FDA and has been authorized for detection and/or diagnosis of SARS-CoV-2 by FDA under an Emergency Use Authorization (EUA). This EUA will remain in effect (meaning this test can be used) for the duration of the COVID-19 declaration under Section 564(b)(1) of the Act, 21 U.S.C. section 360bbb-3(b)(1), unless the authorization is terminated or revoked.  Performed at [facility], [place] [address], [place], [place] [address]          Radiology Studies: DG Chest 2 View  Result Date: 10/25/2020 CLINICAL DATA:  Heart from earlier, shortness of breath. EXAM: CHEST - 2 VIEW COMPARISON:  Mar 19, 2020 FINDINGS: The mediastinal contour is normal. Heart size is enlarged. Right upper lobe mass is more prominent compared to prior chest x-ray. Increased pulmonary interstitium is identified in the left lung. IMPRESSION: 1. Right upper lobe mass is more prominent compared to prior chest x-ray. 2. Mild congestive heart failure. Electronically Signed   By: Abelardo Diesel M.D.   On: 10/25/2020 12:44   CT Angio Chest PE W and/or Wo Contrast  Result Date: 10/25/2020 CLINICAL DATA:  70 year old male with suspected pulmonary embolism. Congestive heart failure and possible pneumonia. Evaluate for pulmonary embolism. EXAM: CT ANGIOGRAPHY CHEST WITH CONTRAST TECHNIQUE: Multidetector CT imaging of the chest was performed using the standard protocol during bolus administration of intravenous contrast. Multiplanar CT image reconstructions and MIPs were obtained to evaluate the vascular anatomy. CONTRAST:  35mL OMNIPAQUE IOHEXOL 350 MG/ML SOLN COMPARISON:  Chest CT 01/23/2020. FINDINGS: Comment: Today's study is limited by extensive  patient respiratory motion. Cardiovascular: No definite central or lobar filling defects to suggest large pulmonary embolism. Unfortunately, smaller segmental and subsegmental sized of filling defects cannot be entirely excluded on the basis of today's motion limited examination. Heart size is mildly enlarged. There is no significant pericardial fluid, thickening or pericardial calcification. There is aortic atherosclerosis, as well as atherosclerosis of the great vessels of the mediastinum and the coronary arteries, including calcified atherosclerotic plaque in the left main, left anterior descending and left circumflex coronary arteries. Mild calcifications of the aortic valve. Mediastinum/Nodes: No pathologically enlarged mediastinal or hilar lymph nodes. Esophagus is unremarkable in appearance. No axillary lymphadenopathy. Lungs/Pleura: Previously noted right upper lobe pulmonary nodule is again noted present measuring approximately 2.7 x 2.2 cm, with new areas of surrounding architectural distortion, volume loss, ground-glass attenuation and septal thickening with extensive architectural distortion, most compatible with evolving postradiation changes. No pleural effusions. Upper Abdomen: Aortic atherosclerosis.  Status post cholecystectomy. Musculoskeletal: There are no aggressive appearing lytic or blastic lesions noted in the visualized portions of the skeleton.  Review of the MIP images confirms the above findings. IMPRESSION: 1. Severely limited examination demonstrating no central or lobar sized pulmonary embolism. Smaller segmental and subsegmental sized be entirely excluded pulmonary emboli cannot on the basis of today's examination. 2. Treatment related changes surrounding patient's right upper lobe lung cancer, presumably from prior radiation therapy, as above. 3. Mild cardiomegaly. 4. Aortic atherosclerosis, in addition to left main and 2 vessel coronary artery disease. Assessment for potential risk  factor modification, dietary therapy or pharmacologic therapy may be warranted, if clinically indicated. 5. There are calcifications of the aortic valve. Echocardiographic correlation for evaluation of potential valvular dysfunction may be warranted if clinically indicated. Aortic Atherosclerosis (ICD10-I70.0). Electronically Signed   By: Vinnie Langton M.D.   On: 10/25/2020 15:31   ECHOCARDIOGRAM COMPLETE  Result Date: 10/26/2020    ECHOCARDIOGRAM REPORT   Patient Name:   LADARRIUS BOGDANSKI Haegele Date of Exam: 10/26/2020 Medical Rec #:  [id number]        Height:       71.0 in Accession #:    [id number]       Weight:       188.1 lb Date of Birth:  Mar 22, 1951       BSA:          2.054 m Patient Age:    18 years         BP:           107/85 mmHg Patient Gender: M                HR:           92 bpm. Exam Location:  Inpatient Procedure: 2D Echo, 3D Echo, Color Doppler and Cardiac Doppler Indications:     Cardiomyopathy-Unspecified I42.9  History:         Patient has prior history of Echocardiogram examinations, most                  recent 01/24/2020. CHF; Arrythmias:Tachycardia. Lung cancer.  Sonographer:     Darlina Sicilian RDCS Referring Phys:  [id number] Barb Merino Diagnosing Phys: Kirk Ruths MD IMPRESSIONS  1. Left ventricular ejection fraction, by estimation, is <20%. The left ventricle has severely decreased function. The left ventricle demonstrates global hypokinesis. The left ventricular internal cavity size was severely dilated. Left ventricular diastolic parameters are consistent with Grade II diastolic dysfunction (pseudonormalization). Elevated left atrial pressure.  2. Right ventricular systolic function is normal. The right ventricular size is mildly enlarged. There is moderately elevated pulmonary artery systolic pressure.  3. Left atrial size was severely dilated.  4. The mitral valve is normal in structure. Moderate to severe mitral valve regurgitation. No evidence of mitral stenosis.  5. Tricuspid valve  regurgitation is mild to moderate.  6. The aortic valve is tricuspid. Aortic valve regurgitation is mild. Mild aortic valve sclerosis is present, with no evidence of aortic valve stenosis.  7. Aortic dilatation noted. There is mild dilatation of the aortic root, measuring 43 mm. There is mild dilatation of the ascending aorta, measuring 44 mm.  8. The inferior vena cava is dilated in size with <50% respiratory variability, suggesting right atrial pressure of 15 mmHg. FINDINGS  Left Ventricle: Left ventricular ejection fraction, by estimation, is <20%. The left ventricle has severely decreased function. The left ventricle demonstrates global hypokinesis. The left ventricular internal cavity size was severely dilated. There is no left ventricular hypertrophy. Left ventricular diastolic parameters are consistent with Grade II diastolic dysfunction (pseudonormalization). Elevated left  atrial pressure. Right Ventricle: The right ventricular size is mildly enlarged. Right ventricular systolic function is normal. There is moderately elevated pulmonary artery systolic pressure. The tricuspid regurgitant velocity is 3.01 m/s, and with an assumed right atrial pressure of 15 mmHg, the estimated right ventricular systolic pressure is 40.8 mmHg. Left Atrium: Left atrial size was severely dilated. Right Atrium: Right atrial size was normal in size. Pericardium: There is no evidence of pericardial effusion. Mitral Valve: The mitral valve is normal in structure. Moderate to severe mitral valve regurgitation. No evidence of mitral valve stenosis. MV peak gradient, 7.2 mmHg. The mean mitral valve gradient is 4.0 mmHg. Tricuspid Valve: The tricuspid valve is normal in structure. Tricuspid valve regurgitation is mild to moderate. No evidence of tricuspid stenosis. Aortic Valve: The aortic valve is tricuspid. Aortic valve regurgitation is mild. Aortic regurgitation PHT measures 332 msec. Mild aortic valve sclerosis is present, with no  evidence of aortic valve stenosis. Pulmonic Valve: The pulmonic valve was normal in structure. Pulmonic valve regurgitation is not visualized. No evidence of pulmonic stenosis. Aorta: Aortic dilatation noted. There is mild dilatation of the aortic root, measuring 43 mm. There is mild dilatation of the ascending aorta, measuring 44 mm. Venous: The inferior vena cava is dilated in size with less than 50% respiratory variability, suggesting right atrial pressure of 15 mmHg.   LEFT VENTRICLE PLAX 2D LVIDd:         7.00 cm  Diastology LVIDs:         6.40 cm  LV e' medial:    3.30 cm/s LV PW:         0.90 cm  LV E/e' medial:  36.1 LV IVS:        0.80 cm  LV e' lateral:   4.30 cm/s LVOT diam:     2.30 cm  LV E/e' lateral: 27.7 LV SV:         32 LV SV Index:   15 LVOT Area:     4.15 cm                          3D Volume EF:                         3D EF:        12 %                         LV EDV:       323 ml                         LV ESV:       285 ml                         LV SV:        39 ml RIGHT VENTRICLE RV S prime:     13.80 cm/s TAPSE (M-mode): 2.2 cm LEFT ATRIUM              Index       RIGHT ATRIUM           Index LA diam:        4.20 cm  2.04 cm/m  RA Area:     19.00 cm LA Vol (A2C):   113.0 ml 55.01 ml/m RA Volume:   56.00 ml  27.26 ml/m LA Vol (A4C):   107.0 ml 52.09 ml/m LA Biplane Vol: 111.0 ml 54.04 ml/m  AORTIC VALVE LVOT Vmax:   57.00 cm/s LVOT Vmean:  37.500 cm/s LVOT VTI:    0.076 m AI PHT:      332 msec  AORTA Ao Root diam: 4.30 cm Ao Asc diam:  4.40 cm MITRAL VALVE                 TRICUSPID VALVE MV Area (PHT): 7.29 cm      TR Peak grad:   36.2 mmHg MV Peak grad:  7.2 mmHg      TR Vmax:        301.00 cm/s MV Mean grad:  4.0 mmHg MV Vmax:       1.34 m/s      SHUNTS MV Vmean:      91.1 cm/s     Systemic VTI:  0.08 m MV Decel Time: 104 msec      Systemic Diam: 2.30 cm MR Peak grad:    85.0 mmHg MR Mean grad:    53.5 mmHg MR Vmax:         461.00 cm/s MR Vmean:        344.0 cm/s MR PISA:          4.02 cm MR PISA Eff ROA: 15 mm MR PISA Radius:  0.80 cm MV E velocity: 119.00 cm/s MV A velocity: 91.20 cm/s MV E/A ratio:  1.30 Kirk Ruths MD Electronically signed by Kirk Ruths MD Signature Date/Time: 10/26/2020/11:01:51 AM    Final    US Abdomen Limited RUQ (LIVER/GB)  Result Date: 10/26/2020 CLINICAL DATA:  Abnormal LFTs EXAM: ULTRASOUND ABDOMEN LIMITED RIGHT UPPER QUADRANT COMPARISON:  None. FINDINGS: Gallbladder: Prior cholecystectomy Common bile duct: Diameter: Normal caliber, 5 mm Liver: No focal lesion identified. Within normal limits in parenchymal echogenicity. Portal vein is patent on color Doppler imaging with normal direction of blood flow towards the liver. Other: Small right pleural effusion. IMPRESSION: Prior cholecystectomy. No acute findings in the right upper quadrant. Small right pleural effusion. Electronically Signed   By: Rolm Baptise M.D.   On: 10/26/2020 12:26        Scheduled Meds: . enoxaparin (LOVENOX) injection  40 mg Subcutaneous Q24H  . furosemide  40 mg Intravenous BID  . metoprolol succinate  25 mg Oral Daily  . pantoprazole  40 mg Oral Daily  . [START ON 10/27/2020] pneumococcal 23 valent vaccine  0.5 mL Intramuscular Tomorrow-1000  . sodium chloride flush  3 mL Intravenous Q12H   Continuous Infusions: . sodium chloride       LOS: 1 day     Time spent: 35 minutes    Barb Merino, MD [facility] Pager [phone]

## 2020-10-26 NOTE — Progress Notes (Signed)
  Echocardiogram 2D Echocardiogram with 3D has been performed.  Darlina Sicilian M 10/26/2020, 10:50 AM

## 2020-10-27 DIAGNOSIS — R0600 Dyspnea, unspecified: Secondary | ICD-10-CM | POA: Diagnosis not present

## 2020-10-27 DIAGNOSIS — Z7189 Other specified counseling: Secondary | ICD-10-CM | POA: Diagnosis not present

## 2020-10-27 DIAGNOSIS — I5043 Acute on chronic combined systolic (congestive) and diastolic (congestive) heart failure: Secondary | ICD-10-CM | POA: Diagnosis not present

## 2020-10-27 DIAGNOSIS — Z515 Encounter for palliative care: Secondary | ICD-10-CM

## 2020-10-27 DIAGNOSIS — R945 Abnormal results of liver function studies: Secondary | ICD-10-CM | POA: Diagnosis not present

## 2020-10-27 DIAGNOSIS — I5023 Acute on chronic systolic (congestive) heart failure: Secondary | ICD-10-CM | POA: Diagnosis not present

## 2020-10-27 LAB — COMPREHENSIVE METABOLIC PANEL
ALT: 1357 U/L — ABNORMAL HIGH (ref 0–44)
AST: 921 U/L — ABNORMAL HIGH (ref 15–41)
Albumin: 3.3 g/dL — ABNORMAL LOW (ref 3.5–5.0)
Alkaline Phosphatase: 164 U/L — ABNORMAL HIGH (ref 38–126)
Anion gap: 13 (ref 5–15)
BUN: 43 mg/dL — ABNORMAL HIGH (ref 8–23)
CO2: 27 mmol/L (ref 22–32)
Calcium: 8.3 mg/dL — ABNORMAL LOW (ref 8.9–10.3)
Chloride: 100 mmol/L (ref 98–111)
Creatinine, Ser: 1.1 mg/dL (ref 0.61–1.24)
GFR, Estimated: >60 mL/min (ref >60)
Glucose, Bld: 90 mg/dL (ref 70–99)
Potassium: 2.9 mmol/L — ABNORMAL LOW (ref 3.5–5.1)
Sodium: 140 mmol/L (ref 135–145)
Total Bilirubin: 2.9 mg/dL — ABNORMAL HIGH (ref 0.3–1.2)
Total Protein: 6.5 g/dL (ref 6.5–8.1)

## 2020-10-27 LAB — CBC WITH DIFFERENTIAL/PLATELET
Abs Immature Granulocytes: 0.04 10*3/uL (ref 0.00–0.07)
Basophils Absolute: 0.1 10*3/uL (ref 0.0–0.1)
Basophils Relative: 1 %
Eosinophils Absolute: 0.1 10*3/uL (ref 0.0–0.5)
Eosinophils Relative: 1 %
HCT: 41.6 % (ref 39.0–52.0)
Hemoglobin: 13.1 g/dL (ref 13.0–17.0)
Immature Granulocytes: 0 %
Lymphocytes Relative: 13 %
Lymphs Abs: 1.3 10*3/uL (ref 0.7–4.0)
MCH: 27.8 pg (ref 26.0–34.0)
MCHC: 31.5 g/dL (ref 30.0–36.0)
MCV: 88.1 fL (ref 80.0–100.0)
Monocytes Absolute: 0.9 10*3/uL (ref 0.1–1.0)
Monocytes Relative: 9 %
Neutro Abs: 7.7 10*3/uL (ref 1.7–7.7)
Neutrophils Relative %: 76 %
Platelets: 297 10*3/uL (ref 150–400)
RBC: 4.72 MIL/uL (ref 4.22–5.81)
RDW: 15.2 % (ref 11.5–15.5)
WBC: 10.1 10*3/uL (ref 4.0–10.5)
nRBC: 0.2 % (ref 0.0–0.2)

## 2020-10-27 LAB — MAGNESIUM: Magnesium: 2.2 mg/dL (ref 1.7–2.4)

## 2020-10-27 LAB — PROTIME-INR
INR: 1.6 — ABNORMAL HIGH (ref 0.8–1.2)
Prothrombin Time: 18.2 seconds — ABNORMAL HIGH (ref 11.4–15.2)

## 2020-10-27 LAB — AMMONIA: Ammonia: 23 umol/L (ref 9–35)

## 2020-10-27 LAB — PHOSPHORUS: Phosphorus: 3.1 mg/dL (ref 2.5–4.6)

## 2020-10-27 MED ORDER — POTASSIUM CHLORIDE CRYS ER 20 MEQ PO TBCR
40.0000 meq | EXTENDED_RELEASE_TABLET | ORAL | Status: AC
  Administered 2020-10-27 (×2): 40 meq via ORAL
  Filled 2020-10-27 (×2): qty 2

## 2020-10-27 MED ORDER — POTASSIUM CHLORIDE CRYS ER 20 MEQ PO TBCR
40.0000 meq | EXTENDED_RELEASE_TABLET | Freq: Once | ORAL | Status: AC
  Administered 2020-10-27: 40 meq via ORAL
  Filled 2020-10-27: qty 2

## 2020-10-27 NOTE — Progress Notes (Addendum)
Progress Note  Patient Name: Philip Campbell Date of Encounter: 10/27/2020  Primary Cardiologist: Donato Heinz, MD   Subjective   Resting with no specific complaints today.   Inpatient Medications    Scheduled Meds: . enoxaparin (LOVENOX) injection  40 mg Subcutaneous Q24H  . furosemide  40 mg Intravenous BID  . metoprolol succinate  25 mg Oral Daily  . pantoprazole  40 mg Oral Daily  . pneumococcal 23 valent vaccine  0.5 mL Intramuscular Tomorrow-1000  . potassium chloride  40 mEq Oral Q4H  . sodium chloride flush  3 mL Intravenous Q12H   Continuous Infusions: . sodium chloride     PRN Meds: sodium chloride, LORazepam, sodium chloride flush   Vital Signs    Vitals:   10/26/20 2111 10/27/20 0140 10/27/20 0533 10/27/20 0911  BP: (!) 89/74 (!) 83/53 95/72 97/75   Pulse: 91 74 84 80  Resp: (!) 24 (!) 26 (!) 25 (!) 28  Temp: 97.7 F (36.5 C) 98.1 F (36.7 C) 98 F (36.7 C) 97.6 F (36.4 C)  TempSrc: Oral Oral Oral Oral  SpO2: 92% (!) 67% 99% 98%  Weight:        Intake/Output Summary (Last 24 hours) at 10/27/2020 0959 Last data filed at 10/27/2020 0700 Gross per 24 hour  Intake 360 ml  Output 2250 ml  Net -1890 ml   Filed Weights   10/26/20 0511  Weight: 85.3 kg    Physical Exam   General: Well developed, well nourished, NAD Neck: Negative for carotid bruits. No JVD Lungs: Diminished in bilateral bases. Breathing is unlabored. Cardiovascular: RRR with S1 S2. No murmurs Extremities: No edema. Radial pulses 2+ bilaterally Neuro: Alert and oriented. No focal deficits. No facial asymmetry. MAE spontaneously. Psych: Responds to questions appropriately with normal affect.    Labs    Chemistry Recent Labs  Lab 10/25/20 1247 10/26/20 0438 10/26/20 1006 10/27/20 0455  NA 133* 134*  --  140  K 4.3 3.6  --  2.9*  CL 97* 97*  --  100  CO2 19* 22  --  27  GLUCOSE 157* 80  --  90  BUN 46* 45*  --  43*  CREATININE 1.68* 1.27*  --  1.10   CALCIUM 8.7* 8.3*  --  8.3*  PROT  --  6.4* 6.9 6.5  ALBUMIN  --  3.3* 3.5 3.3*  AST  --  2,419* 1,957* 921*  ALT  --  1,898* 1,828* 1,357*  ALKPHOS  --  154* 162* 164*  BILITOT  --  3.6* 3.9* 2.9*  GFRNONAA 43* >60  --  >60  ANIONGAP 17* 15  --  13     Hematology Recent Labs  Lab 10/25/20 1247 10/26/20 0438 10/27/20 0455  WBC 10.9* 9.8 10.1  RBC 4.00* 4.13* 4.72  HGB 11.4* 11.9* 13.1  HCT 36.1* 35.9* 41.6  MCV 90.3 86.9 88.1  MCH 28.5 28.8 27.8  MCHC 31.6 33.1 31.5  RDW 15.1 15.2 15.2  PLT 278 265 297    Cardiac EnzymesNo results for input(s): TROPONINI in the last 168 hours. No results for input(s): TROPIPOC in the last 168 hours.   BNP Recent Labs  Lab 10/25/20 1335  BNP 2,346.9*     DDimer No results for input(s): DDIMER in the last 168 hours.   Radiology    DG Chest 2 View  Result Date: 10/25/2020 CLINICAL DATA:  Heart from earlier, shortness of breath. EXAM: CHEST - 2 VIEW COMPARISON:  Mar 19, 2020 FINDINGS: The mediastinal contour is normal. Heart size is enlarged. Right upper lobe mass is more prominent compared to prior chest x-ray. Increased pulmonary interstitium is identified in the left lung. IMPRESSION: 1. Right upper lobe mass is more prominent compared to prior chest x-ray. 2. Mild congestive heart failure. Electronically Signed   By: Abelardo Diesel M.D.   On: 10/25/2020 12:44   CT Angio Chest PE W and/or Wo Contrast  Result Date: 10/25/2020 CLINICAL DATA:  70 year old male with suspected pulmonary embolism. Congestive heart failure and possible pneumonia. Evaluate for pulmonary embolism. EXAM: CT ANGIOGRAPHY CHEST WITH CONTRAST TECHNIQUE: Multidetector CT imaging of the chest was performed using the standard protocol during bolus administration of intravenous contrast. Multiplanar CT image reconstructions and MIPs were obtained to evaluate the vascular anatomy. CONTRAST:  79mL OMNIPAQUE IOHEXOL 350 MG/ML SOLN COMPARISON:  Chest CT 01/23/2020. FINDINGS:  Comment: Today's study is limited by extensive patient respiratory motion. Cardiovascular: No definite central or lobar filling defects to suggest large pulmonary embolism. Unfortunately, smaller segmental and subsegmental sized of filling defects cannot be entirely excluded on the basis of today's motion limited examination. Heart size is mildly enlarged. There is no significant pericardial fluid, thickening or pericardial calcification. There is aortic atherosclerosis, as well as atherosclerosis of the great vessels of the mediastinum and the coronary arteries, including calcified atherosclerotic plaque in the left main, left anterior descending and left circumflex coronary arteries. Mild calcifications of the aortic valve. Mediastinum/Nodes: No pathologically enlarged mediastinal or hilar lymph nodes. Esophagus is unremarkable in appearance. No axillary lymphadenopathy. Lungs/Pleura: Previously noted right upper lobe pulmonary nodule is again noted present measuring approximately 2.7 x 2.2 cm, with new areas of surrounding architectural distortion, volume loss, ground-glass attenuation and septal thickening with extensive architectural distortion, most compatible with evolving postradiation changes. No pleural effusions. Upper Abdomen: Aortic atherosclerosis.  Status post cholecystectomy. Musculoskeletal: There are no aggressive appearing lytic or blastic lesions noted in the visualized portions of the skeleton. Review of the MIP images confirms the above findings. IMPRESSION: 1. Severely limited examination demonstrating no central or lobar sized pulmonary embolism. Smaller segmental and subsegmental sized be entirely excluded pulmonary emboli cannot on the basis of today's examination. 2. Treatment related changes surrounding patient's right upper lobe lung cancer, presumably from prior radiation therapy, as above. 3. Mild cardiomegaly. 4. Aortic atherosclerosis, in addition to left main and 2 vessel coronary  artery disease. Assessment for potential risk factor modification, dietary therapy or pharmacologic therapy may be warranted, if clinically indicated. 5. There are calcifications of the aortic valve. Echocardiographic correlation for evaluation of potential valvular dysfunction may be warranted if clinically indicated. Aortic Atherosclerosis (ICD10-I70.0). Electronically Signed   By: Vinnie Langton M.D.   On: 10/25/2020 15:31   ECHOCARDIOGRAM COMPLETE  Result Date: 10/26/2020    ECHOCARDIOGRAM REPORT   Patient Name:   REDMOND WHITTLEY Worster Date of Exam: 10/26/2020 Medical Rec #:  [id number]        Height:       71.0 in Accession #:    [id number]       Weight:       188.1 lb Date of Birth:  1950/12/14       BSA:          2.054 m Patient Age:    79 years         BP:           107/85 mmHg Patient Gender: M  HR:           92 bpm. Exam Location:  Inpatient Procedure: 2D Echo, 3D Echo, Color Doppler and Cardiac Doppler Indications:     Cardiomyopathy-Unspecified I42.9  History:         Patient has prior history of Echocardiogram examinations, most                  recent 01/24/2020. CHF; Arrythmias:Tachycardia. Lung cancer.  Sonographer:     Darlina Sicilian RDCS Referring Phys:  [id number] Barb Merino Diagnosing Phys: Kirk Ruths MD IMPRESSIONS  1. Left ventricular ejection fraction, by estimation, is <20%. The left ventricle has severely decreased function. The left ventricle demonstrates global hypokinesis. The left ventricular internal cavity size was severely dilated. Left ventricular diastolic parameters are consistent with Grade II diastolic dysfunction (pseudonormalization). Elevated left atrial pressure.  2. Right ventricular systolic function is normal. The right ventricular size is mildly enlarged. There is moderately elevated pulmonary artery systolic pressure.  3. Left atrial size was severely dilated.  4. The mitral valve is normal in structure. Moderate to severe mitral valve regurgitation. No  evidence of mitral stenosis.  5. Tricuspid valve regurgitation is mild to moderate.  6. The aortic valve is tricuspid. Aortic valve regurgitation is mild. Mild aortic valve sclerosis is present, with no evidence of aortic valve stenosis.  7. Aortic dilatation noted. There is mild dilatation of the aortic root, measuring 43 mm. There is mild dilatation of the ascending aorta, measuring 44 mm.  8. The inferior vena cava is dilated in size with <50% respiratory variability, suggesting right atrial pressure of 15 mmHg. FINDINGS  Left Ventricle: Left ventricular ejection fraction, by estimation, is <20%. The left ventricle has severely decreased function. The left ventricle demonstrates global hypokinesis. The left ventricular internal cavity size was severely dilated. There is no left ventricular hypertrophy. Left ventricular diastolic parameters are consistent with Grade II diastolic dysfunction (pseudonormalization). Elevated left atrial pressure. Right Ventricle: The right ventricular size is mildly enlarged. Right ventricular systolic function is normal. There is moderately elevated pulmonary artery systolic pressure. The tricuspid regurgitant velocity is 3.01 m/s, and with an assumed right atrial pressure of 15 mmHg, the estimated right ventricular systolic pressure is 19.1 mmHg. Left Atrium: Left atrial size was severely dilated. Right Atrium: Right atrial size was normal in size. Pericardium: There is no evidence of pericardial effusion. Mitral Valve: The mitral valve is normal in structure. Moderate to severe mitral valve regurgitation. No evidence of mitral valve stenosis. MV peak gradient, 7.2 mmHg. The mean mitral valve gradient is 4.0 mmHg. Tricuspid Valve: The tricuspid valve is normal in structure. Tricuspid valve regurgitation is mild to moderate. No evidence of tricuspid stenosis. Aortic Valve: The aortic valve is tricuspid. Aortic valve regurgitation is mild. Aortic regurgitation PHT measures 332 msec.  Mild aortic valve sclerosis is present, with no evidence of aortic valve stenosis. Pulmonic Valve: The pulmonic valve was normal in structure. Pulmonic valve regurgitation is not visualized. No evidence of pulmonic stenosis. Aorta: Aortic dilatation noted. There is mild dilatation of the aortic root, measuring 43 mm. There is mild dilatation of the ascending aorta, measuring 44 mm. Venous: The inferior vena cava is dilated in size with less than 50% respiratory variability, suggesting right atrial pressure of 15 mmHg.   LEFT VENTRICLE PLAX 2D LVIDd:         7.00 cm  Diastology LVIDs:         6.40 cm  LV e' medial:  3.30 cm/s LV PW:         0.90 cm  LV E/e' medial:  36.1 LV IVS:        0.80 cm  LV e' lateral:   4.30 cm/s LVOT diam:     2.30 cm  LV E/e' lateral: 27.7 LV SV:         32 LV SV Index:   15 LVOT Area:     4.15 cm                          3D Volume EF:                         3D EF:        12 %                         LV EDV:       323 ml                         LV ESV:       285 ml                         LV SV:        39 ml RIGHT VENTRICLE RV S prime:     13.80 cm/s TAPSE (M-mode): 2.2 cm LEFT ATRIUM              Index       RIGHT ATRIUM           Index LA diam:        4.20 cm  2.04 cm/m  RA Area:     19.00 cm LA Vol (A2C):   113.0 ml 55.01 ml/m RA Volume:   56.00 ml  27.26 ml/m LA Vol (A4C):   107.0 ml 52.09 ml/m LA Biplane Vol: 111.0 ml 54.04 ml/m  AORTIC VALVE LVOT Vmax:   57.00 cm/s LVOT Vmean:  37.500 cm/s LVOT VTI:    0.076 m AI PHT:      332 msec  AORTA Ao Root diam: 4.30 cm Ao Asc diam:  4.40 cm MITRAL VALVE                 TRICUSPID VALVE MV Area (PHT): 7.29 cm      TR Peak grad:   36.2 mmHg MV Peak grad:  7.2 mmHg      TR Vmax:        301.00 cm/s MV Mean grad:  4.0 mmHg MV Vmax:       1.34 m/s      SHUNTS MV Vmean:      91.1 cm/s     Systemic VTI:  0.08 m MV Decel Time: 104 msec      Systemic Diam: 2.30 cm MR Peak grad:    85.0 mmHg MR Mean grad:    53.5 mmHg MR Vmax:         461.00  cm/s MR Vmean:        344.0 cm/s MR PISA:         4.02 cm MR PISA Eff ROA: 15 mm MR PISA Radius:  0.80 cm MV E velocity: 119.00 cm/s MV A velocity: 91.20 cm/s MV E/A ratio:  1.30 Kirk Ruths MD Electronically signed by Kirk Ruths MD Signature Date/Time: 10/26/2020/11:01:51 AM  Final    US Abdomen Limited RUQ (LIVER/GB)  Result Date: 10/26/2020 CLINICAL DATA:  Abnormal LFTs EXAM: ULTRASOUND ABDOMEN LIMITED RIGHT UPPER QUADRANT COMPARISON:  None. FINDINGS: Gallbladder: Prior cholecystectomy Common bile duct: Diameter: Normal caliber, 5 mm Liver: No focal lesion identified. Within normal limits in parenchymal echogenicity. Portal vein is patent on color Doppler imaging with normal direction of blood flow towards the liver. Other: Small right pleural effusion. IMPRESSION: Prior cholecystectomy. No acute findings in the right upper quadrant. Small right pleural effusion. Electronically Signed   By: Rolm Baptise M.D.   On: 10/26/2020 12:26   Telemetry    10/27/2020 NSR with HR in the 90's - Personally Reviewed  ECG    No new tracing as of 10/27/2020- Personally Reviewed  Cardiac Studies   Echocardiogram 10/26/20:  1. Left ventricular ejection fraction, by estimation, is <20%. The left  ventricle has severely decreased function. The left ventricle demonstrates  global hypokinesis. The left ventricular internal cavity size was severely  dilated. Left ventricular  diastolic parameters are consistent with Grade II diastolic dysfunction  (pseudonormalization). Elevated left atrial pressure.  2. Right ventricular systolic function is normal. The right ventricular  size is mildly enlarged. There is moderately elevated pulmonary artery  systolic pressure.  3. Left atrial size was severely dilated.  4. The mitral valve is normal in structure. Moderate to severe mitral  valve regurgitation. No evidence of mitral stenosis.  5. Tricuspid valve regurgitation is mild to moderate.  6. The aortic  valve is tricuspid. Aortic valve regurgitation is mild.  Mild aortic valve sclerosis is present, with no evidence of aortic valve  stenosis.  7. Aortic dilatation noted. There is mild dilatation of the aortic root,  measuring 43 mm. There is mild dilatation of the ascending aorta,  measuring 44 mm.  8. The inferior vena cava is dilated in size with <50% respiratory  variability, suggesting right atrial pressure of 15 mmHg.   CTA PE protocol 10/25/20: IMPRESSION: 1. Severely limited examination demonstrating no central or lobar sized pulmonary embolism. Smaller segmental and subsegmental sized be entirely excluded pulmonary emboli cannot on the basis of today's examination. 2. Treatment related changes surrounding patient's right upper lobe lung cancer, presumably from prior radiation therapy, as above. 3. Mild cardiomegaly. 4. Aortic atherosclerosis, in addition to left main and 2 vessel coronary artery disease. Assessment for potential risk factor modification, dietary therapy or pharmacologic therapy may be warranted, if clinically indicated. 5. There are calcifications of the aortic valve. Echocardiographic correlation for evaluation of potential valvular dysfunction may be warranted if clinically indicated.  Patient Profile     70 y.o. male with a hx of chronic systolic and diastolic heart failure, nonischemic cardiomyopathy, lung mass s/p radiation who is being seen today for the evaluation of acute heart failure at the request of Dr. Sloan Leiter.  Assessment & Plan    1. Acute on chronic systolic and diastolic heart failure: -Echocardiogram 10/26/2020 with EF <20% (known) with restrictive, G3DD and decreased RV function, and mod-severe MR  -Previous cardiac cath at [facility] with non-obstructive CAD, consistent with non-ischemic cardiomyopathy  -Previous recommendations were for cMRI however he has not been able to afford this  -BNP on presentation, 563 with a weight at 85.3kg   -Continue gentle diuresis with IV Lasix 40mg  BID -Continue Toprol 25mg  QD  -Weight, needs updated weight today  -I&O, net negative 3L -Creatinine improving with diuresis>>1.68>>1.27>>1.10 -Will need GDMT however this is limited due to soft BP for now.  2. AKI: -Creatinine, 1.10 today  -See labs above  -Baseline appears to be in the 0.9 range  3. Acute liver injury: -AST >2400, ALT >1800 -Defer to IM for further management  -Hold statin therapy for now   4. Lung mass, s/p radiation>>presume non-small cell lung cancer: -Chest CT with post radiation lung changes   Signed, Kathyrn Drown NP-C [facility] Pager: [phone] 10/27/2020, 9:59 AM     For questions or updates, please contact   Please consult [email or web address] for contact info under Cardiology/STEMI.  Patient seen and examined and agree with Kathyrn Drown, NP-C as detailed above.  In brief, the patient is 70 year old male with hx of chronic systolic and diastolic heart failure, nonischemic cardiomyopathy, lung mass s/p radiationwho presented with progressive SOB with BNP 2346 concerning for acute on chronic heart failure exacerbation for which cardiology has been consulted.  TTE with LVEF <20% with diffuse WMA, G2DD (previously grade 3) moderate RV dysfunction and moderate-to-severe MR. Cath at [facility] without obstructive CAD. Was recommended for cMRI for further work-up but has been unable to afford. Has been diuresed with lasix 40mg  IV BID with improvement in volume status. Continues to have shortness of breath which is out-of-proportion to what would be expected from his volume status alone. Likely symptoms are multifactorial in nature due to HFrEF, lung cancer and radiation therapy.   Exam: GEN: No acute distress.  [place] in place Neck: No JVD Cardiac: Tachycardic, regular, 2/6 systolic murmur  Respiratory: Faint crackles at bases GI: Soft, nontender, non-distended  MS: No edema; No deformity. Neuro:  Nonfocal  Psych:  Normal affect   Plan: -Continue gentle diuresis with lasix 40mg  IV BID; likely transition to PO tomorrow -Replete lytes aggressively with goal K>4, Mg>2 -Continue BB for now -Unable to tolerate GDMT currently due to soft blood pressures; will add as able -Work-up of transaminitis per primary team; holding statin -SOB likely multifactorial in setting of HFrEF, radiation lung disease, and lung cancer as degree of symptoms out-of-proportion to volume overload  Gwyndolyn Kaufman, MD

## 2020-10-27 NOTE — Progress Notes (Addendum)
Progress Note    Philip Campbell  IDP:[id number] DOB: 11-18-1950  DOA: 10/25/2020 PCP: Libby Maw, MD      Brief Narrative:    Medical records reviewed and are as summarized below:  Philip Campbell is a 70 y.o. male  with history of chronic systolic congestive heart failure with known ejection fraction less than 20%, nonischemic cardiomyopathy, right upper lobe lung cancer resume non-small cell lung cancer status post radiation treatment and post radiation changes, recently lost to follow-up.  He presented to the hospital with increasing shortness of breath for about 2 months duration.  In the ED, BNP was 2,346 and chest x-ray was suggestive of CHF.  He was admitted to the hospital for acute exacerbation of chronic systolic CHF.   Assessment/Plan:   Principal Problem:   CHF exacerbation (HCC) Active Problems:   Abnormal LFTs (liver function tests)    Body mass index is 26.23 kg/m.     Shortness of breath, multifactorial.  This is probably due to combination of nonischemic cardiomyopathy, valvular heart disease, lung cancer and radiation induced lung damage.   Acute exacerbation of chronic systolic CHF, nonischemic cardiomyopathy: 2D echo on 10/27/2019 showed EF less than 20%.  Continue IV Lasix.  Follow-up with cardiologist.  Continue Toprol.  He is intolerant to ACE-i or ARBs.  Check iron studies.   Elevated liver enzymes: Etiology is unclear but suspected to be due to CHF exacerbation.  Acute hepatitis panel was negative.  Acute hypoxic respiratory failure: Continue 2 L/min oxygen via nasal cannula.  Taper off oxygen as able.  Elevated INR: Improving.  Continue to monitor INR.   AKI: Creatinine is improving.  Suspected to be due to cardiorenal syndrome.  Lung cancer, presumed non-small cell lung cancer: Follow-up with oncologist and radiation oncologist at discharge   Urine drug screen positive for amphetamines and tetrahydrocannabinol.  However,  patient said he does not use any illicit drugs.  He said he has been around people use marijuana.        Diet Order            Diet Heart Room service appropriate? Yes; Fluid consistency: Thin; Fluid restriction: 2000 mL Fluid  Diet effective now                    Consultants:  Cardiologist  Procedures:  None    Medications:   . enoxaparin (LOVENOX) injection  40 mg Subcutaneous Q24H  . furosemide  40 mg Intravenous BID  . metoprolol succinate  25 mg Oral Daily  . pantoprazole  40 mg Oral Daily  . pneumococcal 23 valent vaccine  0.5 mL Intramuscular Tomorrow-1000  . sodium chloride flush  3 mL Intravenous Q12H   Continuous Infusions: . sodium chloride       Anti-infectives (From admission, onward)   None             Family Communication/Anticipated D/C date and plan/Code Status   DVT prophylaxis: enoxaparin (LOVENOX) injection 40 mg Start: 10/25/20 2200     Code Status: Full Code  Family Communication: None Disposition Plan:    Status is: Inpatient  Remains inpatient appropriate because:IV treatments appropriate due to intensity of illness or inability to take PO   Dispo: The patient is from: Home              Anticipated d/c is to: Home              Anticipated d/c date  is: 2 days              Patient currently is not medically stable to d/c.           Subjective:   C/o shortness of breath but overall he thinks he is a little better.  Objective:    Vitals:   10/27/20 0140 10/27/20 0533 10/27/20 0911 10/27/20 1414  BP: (!) 83/53 95/72 97/75  95/78  Pulse: 74 84 80 98  Resp: (!) 26 (!) 25 (!) 28 20  Temp: 98.1 F (36.7 C) 98 F (36.7 C) 97.6 F (36.4 C) 98 F (36.7 C)  TempSrc: Oral Oral Oral Oral  SpO2: (!) 67% 99% 98% 94%  Weight:       No data found.   Intake/Output Summary (Last 24 hours) at 10/27/2020 1651 Last data filed at 10/27/2020 1500 Gross per 24 hour  Intake 600 ml  Output 2800 ml  Net -2200 ml    Filed Weights   10/26/20 0511  Weight: 85.3 kg    Exam:  GEN: NAD SKIN: No rash EYES: EOMI ENT: MMM CV: RRR PULM: CTA B ABD: soft, ND, NT, +BS CNS: AAO x 3, non focal EXT: No edema or tenderness    Data Reviewed:   I have personally reviewed following labs and imaging studies:  Labs: Labs show the following:   Basic Metabolic Panel: Recent Labs  Lab 10/25/20 1247 10/26/20 0438 10/27/20 0455  NA 133* 134* 140  K 4.3 3.6 2.9*  CL 97* 97* 100  CO2 19* 22 27  GLUCOSE 157* 80 90  BUN 46* 45* 43*  CREATININE 1.68* 1.27* 1.10  CALCIUM 8.7* 8.3* 8.3*  MG 1.9  --  2.2  PHOS  --   --  3.1   GFR Estimated Creatinine Clearance: 65.6 mL/min (by C-G formula based on SCr of 1.1 mg/dL). Liver Function Tests: Recent Labs  Lab 10/26/20 0438 10/26/20 1006 10/27/20 0455  AST 2,419* 1,957* 921*  ALT 1,898* 1,828* 1,357*  ALKPHOS 154* 162* 164*  BILITOT 3.6* 3.9* 2.9*  PROT 6.4* 6.9 6.5  ALBUMIN 3.3* 3.5 3.3*   No results for input(s): LIPASE, AMYLASE in the last 168 hours. Recent Labs  Lab 10/26/20 1412 10/27/20 0455  AMMONIA 13 23   Coagulation profile Recent Labs  Lab 10/25/20 1247 10/26/20 1006 10/27/20 0455  INR 2.2* 1.9* 1.6*    CBC: Recent Labs  Lab 10/25/20 1247 10/26/20 0438 10/27/20 0455  WBC 10.9* 9.8 10.1  NEUTROABS  --   --  7.7  HGB 11.4* 11.9* 13.1  HCT 36.1* 35.9* 41.6  MCV 90.3 86.9 88.1  PLT 278 265 297   Cardiac Enzymes: No results for input(s): CKTOTAL, CKMB, CKMBINDEX, TROPONINI in the last 168 hours. BNP (last 3 results) Recent Labs    10/01/20 1441  PROBNP 563.0*   CBG: No results for input(s): GLUCAP in the last 168 hours. D-Dimer: No results for input(s): DDIMER in the last 72 hours. Hgb A1c: No results for input(s): HGBA1C in the last 72 hours. Lipid Profile: No results for input(s): CHOL, HDL, LDLCALC, TRIG, CHOLHDL, LDLDIRECT in the last 72 hours. Thyroid function studies: No results for input(s): TSH,  T4TOTAL, T3FREE, THYROIDAB in the last 72 hours.  Invalid input(s): FREET3 Anemia work up: No results for input(s): VITAMINB12, FOLATE, FERRITIN, TIBC, IRON, RETICCTPCT in the last 72 hours. Sepsis Labs: Recent Labs  Lab 10/25/20 1247 10/26/20 0438 10/27/20 0455  WBC 10.9* 9.8 10.1    Microbiology Recent  Results (from the past 240 hour(s))  Resp Panel by RT-PCR (Flu A&B, Covid) Nasopharyngeal Swab     Status: None   Collection Time: 10/25/20  2:20 PM   Specimen: Nasopharyngeal Swab; Nasopharyngeal(NP) swabs in vial transport medium  Result Value Ref Range Status   SARS Coronavirus 2 by RT PCR NEGATIVE NEGATIVE Final    Comment: (NOTE) SARS-CoV-2 target nucleic acids are NOT DETECTED.  The SARS-CoV-2 RNA is generally detectable in upper respiratory specimens during the acute phase of infection. The lowest concentration of SARS-CoV-2 viral copies this assay can detect is 138 copies/mL. A negative result does not preclude SARS-Cov-2 infection and should not be used as the sole basis for treatment or other patient management decisions. A negative result may occur with  improper specimen collection/handling, submission of specimen other than nasopharyngeal swab, presence of viral mutation(s) within the areas targeted by this assay, and inadequate number of viral copies(<138 copies/mL). A negative result must be combined with clinical observations, patient history, and epidemiological information. The expected result is Negative.  Fact Sheet for Patients:  EntrepreneurPulse.com.au  Fact Sheet for Healthcare Providers:  IncredibleEmployment.be  This test is no t yet approved or cleared by the [place] FDA and  has been authorized for detection and/or diagnosis of SARS-CoV-2 by FDA under an Emergency Use Authorization (EUA). This EUA will remain  in effect (meaning this test can be used) for the duration of the COVID-19 declaration under  Section 564(b)(1) of the Act, 21 U.S.C.section 360bbb-3(b)(1), unless the authorization is terminated  or revoked sooner.       Influenza A by PCR NEGATIVE NEGATIVE Final   Influenza B by PCR NEGATIVE NEGATIVE Final    Comment: (NOTE) The Xpert Xpress SARS-CoV-2/FLU/RSV plus assay is intended as an aid in the diagnosis of influenza from Nasopharyngeal swab specimens and should not be used as a sole basis for treatment. Nasal washings and aspirates are unacceptable for Xpert Xpress SARS-CoV-2/FLU/RSV testing.  Fact Sheet for Patients: EntrepreneurPulse.com.au  Fact Sheet for Healthcare Providers: IncredibleEmployment.be  This test is not yet approved or cleared by the [place] FDA and has been authorized for detection and/or diagnosis of SARS-CoV-2 by FDA under an Emergency Use Authorization (EUA). This EUA will remain in effect (meaning this test can be used) for the duration of the COVID-19 declaration under Section 564(b)(1) of the Act, 21 U.S.C. section 360bbb-3(b)(1), unless the authorization is terminated or revoked.  Performed at [facility], [place] [address] [place], [place] [address]     Procedures and diagnostic studies:  ECHOCARDIOGRAM COMPLETE  Result Date: 10/26/2020    ECHOCARDIOGRAM REPORT   Patient Name:   JAMEIRE KOUBA Austell Date of Exam: 10/26/2020 Medical Rec #:  [id number]        Height:       71.0 in Accession #:    [id number]       Weight:       188.1 lb Date of Birth:  26-Jan-1951       BSA:          2.054 m Patient Age:    52 years         BP:           107/85 mmHg Patient Gender: M                HR:           92 bpm. Exam Location:  Inpatient Procedure: 2D Echo, 3D Echo, Color Doppler and  Cardiac Doppler Indications:     Cardiomyopathy-Unspecified I42.9  History:         Patient has prior history of Echocardiogram examinations, most                  recent 01/24/2020. CHF; Arrythmias:Tachycardia. Lung  cancer.  Sonographer:     Darlina Sicilian RDCS Referring Phys:  [id number] Barb Merino Diagnosing Phys: Kirk Ruths MD IMPRESSIONS  1. Left ventricular ejection fraction, by estimation, is <20%. The left ventricle has severely decreased function. The left ventricle demonstrates global hypokinesis. The left ventricular internal cavity size was severely dilated. Left ventricular diastolic parameters are consistent with Grade II diastolic dysfunction (pseudonormalization). Elevated left atrial pressure.  2. Right ventricular systolic function is normal. The right ventricular size is mildly enlarged. There is moderately elevated pulmonary artery systolic pressure.  3. Left atrial size was severely dilated.  4. The mitral valve is normal in structure. Moderate to severe mitral valve regurgitation. No evidence of mitral stenosis.  5. Tricuspid valve regurgitation is mild to moderate.  6. The aortic valve is tricuspid. Aortic valve regurgitation is mild. Mild aortic valve sclerosis is present, with no evidence of aortic valve stenosis.  7. Aortic dilatation noted. There is mild dilatation of the aortic root, measuring 43 mm. There is mild dilatation of the ascending aorta, measuring 44 mm.  8. The inferior vena cava is dilated in size with <50% respiratory variability, suggesting right atrial pressure of 15 mmHg. FINDINGS  Left Ventricle: Left ventricular ejection fraction, by estimation, is <20%. The left ventricle has severely decreased function. The left ventricle demonstrates global hypokinesis. The left ventricular internal cavity size was severely dilated. There is no left ventricular hypertrophy. Left ventricular diastolic parameters are consistent with Grade II diastolic dysfunction (pseudonormalization). Elevated left atrial pressure. Right Ventricle: The right ventricular size is mildly enlarged. Right ventricular systolic function is normal. There is moderately elevated pulmonary artery systolic pressure. The  tricuspid regurgitant velocity is 3.01 m/s, and with an assumed right atrial pressure of 15 mmHg, the estimated right ventricular systolic pressure is 07.3 mmHg. Left Atrium: Left atrial size was severely dilated. Right Atrium: Right atrial size was normal in size. Pericardium: There is no evidence of pericardial effusion. Mitral Valve: The mitral valve is normal in structure. Moderate to severe mitral valve regurgitation. No evidence of mitral valve stenosis. MV peak gradient, 7.2 mmHg. The mean mitral valve gradient is 4.0 mmHg. Tricuspid Valve: The tricuspid valve is normal in structure. Tricuspid valve regurgitation is mild to moderate. No evidence of tricuspid stenosis. Aortic Valve: The aortic valve is tricuspid. Aortic valve regurgitation is mild. Aortic regurgitation PHT measures 332 msec. Mild aortic valve sclerosis is present, with no evidence of aortic valve stenosis. Pulmonic Valve: The pulmonic valve was normal in structure. Pulmonic valve regurgitation is not visualized. No evidence of pulmonic stenosis. Aorta: Aortic dilatation noted. There is mild dilatation of the aortic root, measuring 43 mm. There is mild dilatation of the ascending aorta, measuring 44 mm. Venous: The inferior vena cava is dilated in size with less than 50% respiratory variability, suggesting right atrial pressure of 15 mmHg.   LEFT VENTRICLE PLAX 2D LVIDd:         7.00 cm  Diastology LVIDs:         6.40 cm  LV e' medial:    3.30 cm/s LV PW:         0.90 cm  LV E/e' medial:  36.1 LV IVS:  0.80 cm  LV e' lateral:   4.30 cm/s LVOT diam:     2.30 cm  LV E/e' lateral: 27.7 LV SV:         32 LV SV Index:   15 LVOT Area:     4.15 cm                          3D Volume EF:                         3D EF:        12 %                         LV EDV:       323 ml                         LV ESV:       285 ml                         LV SV:        39 ml RIGHT VENTRICLE RV S prime:     13.80 cm/s TAPSE (M-mode): 2.2 cm LEFT ATRIUM               Index       RIGHT ATRIUM           Index LA diam:        4.20 cm  2.04 cm/m  RA Area:     19.00 cm LA Vol (A2C):   113.0 ml 55.01 ml/m RA Volume:   56.00 ml  27.26 ml/m LA Vol (A4C):   107.0 ml 52.09 ml/m LA Biplane Vol: 111.0 ml 54.04 ml/m  AORTIC VALVE LVOT Vmax:   57.00 cm/s LVOT Vmean:  37.500 cm/s LVOT VTI:    0.076 m AI PHT:      332 msec  AORTA Ao Root diam: 4.30 cm Ao Asc diam:  4.40 cm MITRAL VALVE                 TRICUSPID VALVE MV Area (PHT): 7.29 cm      TR Peak grad:   36.2 mmHg MV Peak grad:  7.2 mmHg      TR Vmax:        301.00 cm/s MV Mean grad:  4.0 mmHg MV Vmax:       1.34 m/s      SHUNTS MV Vmean:      91.1 cm/s     Systemic VTI:  0.08 m MV Decel Time: 104 msec      Systemic Diam: 2.30 cm MR Peak grad:    85.0 mmHg MR Mean grad:    53.5 mmHg MR Vmax:         461.00 cm/s MR Vmean:        344.0 cm/s MR PISA:         4.02 cm MR PISA Eff ROA: 15 mm MR PISA Radius:  0.80 cm MV E velocity: 119.00 cm/s MV A velocity: 91.20 cm/s MV E/A ratio:  1.30 Kirk Ruths MD Electronically signed by Kirk Ruths MD Signature Date/Time: 10/26/2020/11:01:51 AM    Final    US Abdomen Limited RUQ (LIVER/GB)  Result Date: 10/26/2020 CLINICAL DATA:  Abnormal LFTs EXAM: ULTRASOUND ABDOMEN LIMITED RIGHT UPPER QUADRANT COMPARISON:  None.  FINDINGS: Gallbladder: Prior cholecystectomy Common bile duct: Diameter: Normal caliber, 5 mm Liver: No focal lesion identified. Within normal limits in parenchymal echogenicity. Portal vein is patent on color Doppler imaging with normal direction of blood flow towards the liver. Other: Small right pleural effusion. IMPRESSION: Prior cholecystectomy. No acute findings in the right upper quadrant. Small right pleural effusion. Electronically Signed   By: Rolm Baptise M.D.   On: 10/26/2020 12:26               LOS: 2 days   Allene Furuya  [facility]   Pager on [email or web address]. If 7PM-7AM, please contact night-coverage at [email or web address]     10/27/2020,  4:51 PM

## 2020-10-28 DIAGNOSIS — I5023 Acute on chronic systolic (congestive) heart failure: Secondary | ICD-10-CM | POA: Diagnosis not present

## 2020-10-28 DIAGNOSIS — N179 Acute kidney failure, unspecified: Secondary | ICD-10-CM | POA: Diagnosis not present

## 2020-10-28 DIAGNOSIS — J9601 Acute respiratory failure with hypoxia: Secondary | ICD-10-CM

## 2020-10-28 DIAGNOSIS — R945 Abnormal results of liver function studies: Secondary | ICD-10-CM | POA: Diagnosis not present

## 2020-10-28 LAB — PROTIME-INR
INR: 1.4 — ABNORMAL HIGH (ref 0.8–1.2)
Prothrombin Time: 16.4 seconds — ABNORMAL HIGH (ref 11.4–15.2)

## 2020-10-28 LAB — COMPREHENSIVE METABOLIC PANEL
ALT: 1120 U/L — ABNORMAL HIGH (ref 0–44)
AST: 499 U/L — ABNORMAL HIGH (ref 15–41)
Albumin: 3.5 g/dL (ref 3.5–5.0)
Alkaline Phosphatase: 158 U/L — ABNORMAL HIGH (ref 38–126)
Anion gap: 13 (ref 5–15)
BUN: 39 mg/dL — ABNORMAL HIGH (ref 8–23)
CO2: 28 mmol/L (ref 22–32)
Calcium: 9 mg/dL (ref 8.9–10.3)
Chloride: 99 mmol/L (ref 98–111)
Creatinine, Ser: 1.1 mg/dL (ref 0.61–1.24)
GFR, Estimated: >60 mL/min (ref >60)
Glucose, Bld: 119 mg/dL — ABNORMAL HIGH (ref 70–99)
Potassium: 4.9 mmol/L (ref 3.5–5.1)
Sodium: 140 mmol/L (ref 135–145)
Total Bilirubin: 3.3 mg/dL — ABNORMAL HIGH (ref 0.3–1.2)
Total Protein: 7.5 g/dL (ref 6.5–8.1)

## 2020-10-28 MED ORDER — FUROSEMIDE 40 MG PO TABS
40.0000 mg | ORAL_TABLET | Freq: Every day | ORAL | Status: DC
  Administered 2020-10-28 – 2020-10-29 (×2): 40 mg via ORAL
  Filled 2020-10-28 (×2): qty 1

## 2020-10-28 MED ORDER — FUROSEMIDE 40 MG PO TABS: 40.0000 mg | ORAL_TABLET | Freq: Two times a day (BID) | ORAL | Status: DC

## 2020-10-28 MED ORDER — FUROSEMIDE 20 MG PO TABS: 20.0000 mg | ORAL_TABLET | Freq: Every day | ORAL | Status: DC

## 2020-10-28 NOTE — Plan of Care (Signed)
  Problem: Education: Goal: Knowledge of General Education information will improve Description: Including pain rating scale, medication(s)/side effects and non-pharmacologic comfort measures Outcome: Progressing   Problem: Health Behavior/Discharge Planning: Goal: Ability to manage health-related needs will improve Outcome: Progressing   Problem: Clinical Measurements: Goal: Ability to maintain clinical measurements within normal limits will improve Outcome: Progressing   Problem: Education: Goal: Ability to demonstrate management of disease process will improve Outcome: Progressing Goal: Ability to verbalize understanding of medication therapies will improve Outcome: Progressing   Problem: Activity: Goal: Capacity to carry out activities will improve Outcome: Progressing

## 2020-10-28 NOTE — Progress Notes (Addendum)
Progress Note  Patient Name: Philip Campbell Date of Encounter: 10/28/2020  [facility] Cardiologist: Donato Heinz, MD   Subjective   Feeling quite weak today. Feels smothered by things around his neck (O2 tubing, telemetry box/wires, gown). Denies any meaningful improvement in his breathing compared to yesterday. No complaints of chest pain or palpitations.   Inpatient Medications    Scheduled Meds: . enoxaparin (LOVENOX) injection  40 mg Subcutaneous Q24H  . furosemide  40 mg Intravenous BID  . metoprolol succinate  25 mg Oral Daily  . pantoprazole  40 mg Oral Daily  . pneumococcal 23 valent vaccine  0.5 mL Intramuscular Tomorrow-1000  . sodium chloride flush  3 mL Intravenous Q12H   Continuous Infusions: . sodium chloride     PRN Meds: sodium chloride, LORazepam, sodium chloride flush   Vital Signs    Vitals:   10/27/20 1752 10/27/20 2119 10/28/20 0459 10/28/20 0855  BP: 107/82 100/84 99/74   Pulse: 74 76 94   Resp:      Temp: (!) 97.2 F (36.2 C) 97.6 F (36.4 C) (!) 97.5 F (36.4 C)   TempSrc: Oral Oral Oral   SpO2: 100% 95% 98%   Weight:   85.3 kg 71.4 kg    Intake/Output Summary (Last 24 hours) at 10/28/2020 0973 Last data filed at 10/28/2020 0600 Gross per 24 hour  Intake 540 ml  Output 1875 ml  Net -1335 ml   Last 3 Weights 10/28/2020 10/28/2020 10/26/2020  Weight (lbs) 157 lb 6.5 oz 188 lb 0.8 oz 188 lb 0.8 oz  Weight (kg) 71.4 kg 85.3 kg 85.3 kg      Telemetry    Sinus rhythm/sinus tachycardia to 110s-120s with occasional PVCs - Personally Reviewed  ECG    No new tracings - Personally Reviewed  Physical Exam   GEN: Chronically ill appearing gentleman who is sleeping upon entering the room. Awakens easily    Neck: No JVD Cardiac: RRR, no murmurs, rubs, or gallops.  Respiratory: Appears mild-moderately tachypneic though lungs are clear to auscultation bilaterally. GI: Soft, nontender, non-distended  MS: No edema; No  deformity. Neuro:  Nonfocal  Psych: Normal affect   Labs    High Sensitivity Troponin:  No results for input(s): TROPONINIHS in the last 720 hours.    Chemistry Recent Labs  Lab 10/26/20 0438 10/26/20 1006 10/27/20 0455 10/28/20 0409  NA 134*  --  140 140  K 3.6  --  2.9* 4.9  CL 97*  --  100 99  CO2 22  --  27 28  GLUCOSE 80  --  90 119*  BUN 45*  --  43* 39*  CREATININE 1.27*  --  1.10 1.10  CALCIUM 8.3*  --  8.3* 9.0  PROT 6.4* 6.9 6.5 7.5  ALBUMIN 3.3* 3.5 3.3* 3.5  AST 2,419* 1,957* 921* 499*  ALT 1,898* 1,828* 1,357* 1,120*  ALKPHOS 154* 162* 164* 158*  BILITOT 3.6* 3.9* 2.9* 3.3*  GFRNONAA >60  --  >60 >60  ANIONGAP 15  --  13 13     Hematology Recent Labs  Lab 10/25/20 1247 10/26/20 0438 10/27/20 0455  WBC 10.9* 9.8 10.1  RBC 4.00* 4.13* 4.72  HGB 11.4* 11.9* 13.1  HCT 36.1* 35.9* 41.6  MCV 90.3 86.9 88.1  MCH 28.5 28.8 27.8  MCHC 31.6 33.1 31.5  RDW 15.1 15.2 15.2  PLT 278 265 297    BNP Recent Labs  Lab 10/25/20 1335  BNP 2,346.9*  DDimer No results for input(s): DDIMER in the last 168 hours.   Radiology    ECHOCARDIOGRAM COMPLETE  Result Date: 10/26/2020    ECHOCARDIOGRAM REPORT   Patient Name:   Philip Campbell Date of Exam: 10/26/2020 Medical Rec #:  [id number]        Height:       71.0 in Accession #:    [id number]       Weight:       188.1 lb Date of Birth:  1951-08-23       BSA:          2.054 m Patient Age:    19 years         BP:           107/85 mmHg Patient Gender: M                HR:           92 bpm. Exam Location:  Inpatient Procedure: 2D Echo, 3D Echo, Color Doppler and Cardiac Doppler Indications:     Cardiomyopathy-Unspecified I42.9  History:         Patient has prior history of Echocardiogram examinations, most                  recent 01/24/2020. CHF; Arrythmias:Tachycardia. Lung cancer.  Sonographer:     Darlina Sicilian RDCS Referring Phys:  [id number] Barb Merino Diagnosing Phys: Kirk Ruths MD IMPRESSIONS  1. Left  ventricular ejection fraction, by estimation, is <20%. The left ventricle has severely decreased function. The left ventricle demonstrates global hypokinesis. The left ventricular internal cavity size was severely dilated. Left ventricular diastolic parameters are consistent with Grade II diastolic dysfunction (pseudonormalization). Elevated left atrial pressure.  2. Right ventricular systolic function is normal. The right ventricular size is mildly enlarged. There is moderately elevated pulmonary artery systolic pressure.  3. Left atrial size was severely dilated.  4. The mitral valve is normal in structure. Moderate to severe mitral valve regurgitation. No evidence of mitral stenosis.  5. Tricuspid valve regurgitation is mild to moderate.  6. The aortic valve is tricuspid. Aortic valve regurgitation is mild. Mild aortic valve sclerosis is present, with no evidence of aortic valve stenosis.  7. Aortic dilatation noted. There is mild dilatation of the aortic root, measuring 43 mm. There is mild dilatation of the ascending aorta, measuring 44 mm.  8. The inferior vena cava is dilated in size with <50% respiratory variability, suggesting right atrial pressure of 15 mmHg. FINDINGS  Left Ventricle: Left ventricular ejection fraction, by estimation, is <20%. The left ventricle has severely decreased function. The left ventricle demonstrates global hypokinesis. The left ventricular internal cavity size was severely dilated. There is no left ventricular hypertrophy. Left ventricular diastolic parameters are consistent with Grade II diastolic dysfunction (pseudonormalization). Elevated left atrial pressure. Right Ventricle: The right ventricular size is mildly enlarged. Right ventricular systolic function is normal. There is moderately elevated pulmonary artery systolic pressure. The tricuspid regurgitant velocity is 3.01 m/s, and with an assumed right atrial pressure of 15 mmHg, the estimated right ventricular systolic  pressure is 30.8 mmHg. Left Atrium: Left atrial size was severely dilated. Right Atrium: Right atrial size was normal in size. Pericardium: There is no evidence of pericardial effusion. Mitral Valve: The mitral valve is normal in structure. Moderate to severe mitral valve regurgitation. No evidence of mitral valve stenosis. MV peak gradient, 7.2 mmHg. The mean mitral valve gradient is 4.0 mmHg. Tricuspid Valve: The tricuspid valve  is normal in structure. Tricuspid valve regurgitation is mild to moderate. No evidence of tricuspid stenosis. Aortic Valve: The aortic valve is tricuspid. Aortic valve regurgitation is mild. Aortic regurgitation PHT measures 332 msec. Mild aortic valve sclerosis is present, with no evidence of aortic valve stenosis. Pulmonic Valve: The pulmonic valve was normal in structure. Pulmonic valve regurgitation is not visualized. No evidence of pulmonic stenosis. Aorta: Aortic dilatation noted. There is mild dilatation of the aortic root, measuring 43 mm. There is mild dilatation of the ascending aorta, measuring 44 mm. Venous: The inferior vena cava is dilated in size with less than 50% respiratory variability, suggesting right atrial pressure of 15 mmHg.   LEFT VENTRICLE PLAX 2D LVIDd:         7.00 cm  Diastology LVIDs:         6.40 cm  LV e' medial:    3.30 cm/s LV PW:         0.90 cm  LV E/e' medial:  36.1 LV IVS:        0.80 cm  LV e' lateral:   4.30 cm/s LVOT diam:     2.30 cm  LV E/e' lateral: 27.7 LV SV:         32 LV SV Index:   15 LVOT Area:     4.15 cm                          3D Volume EF:                         3D EF:        12 %                         LV EDV:       323 ml                         LV ESV:       285 ml                         LV SV:        39 ml RIGHT VENTRICLE RV S prime:     13.80 cm/s TAPSE (M-mode): 2.2 cm LEFT ATRIUM              Index       RIGHT ATRIUM           Index LA diam:        4.20 cm  2.04 cm/m  RA Area:     19.00 cm LA Vol (A2C):   113.0 ml 55.01  ml/m RA Volume:   56.00 ml  27.26 ml/m LA Vol (A4C):   107.0 ml 52.09 ml/m LA Biplane Vol: 111.0 ml 54.04 ml/m  AORTIC VALVE LVOT Vmax:   57.00 cm/s LVOT Vmean:  37.500 cm/s LVOT VTI:    0.076 m AI PHT:      332 msec  AORTA Ao Root diam: 4.30 cm Ao Asc diam:  4.40 cm MITRAL VALVE                 TRICUSPID VALVE MV Area (PHT): 7.29 cm      TR Peak grad:   36.2 mmHg MV Peak grad:  7.2 mmHg      TR Vmax:  301.00 cm/s MV Mean grad:  4.0 mmHg MV Vmax:       1.34 m/s      SHUNTS MV Vmean:      91.1 cm/s     Systemic VTI:  0.08 m MV Decel Time: 104 msec      Systemic Diam: 2.30 cm MR Peak grad:    85.0 mmHg MR Mean grad:    53.5 mmHg MR Vmax:         461.00 cm/s MR Vmean:        344.0 cm/s MR PISA:         4.02 cm MR PISA Eff ROA: 15 mm MR PISA Radius:  0.80 cm MV E velocity: 119.00 cm/s MV A velocity: 91.20 cm/s MV E/A ratio:  1.30 Kirk Ruths MD Electronically signed by Kirk Ruths MD Signature Date/Time: 10/26/2020/11:01:51 AM    Final    US Abdomen Limited RUQ (LIVER/GB)  Result Date: 10/26/2020 CLINICAL DATA:  Abnormal LFTs EXAM: ULTRASOUND ABDOMEN LIMITED RIGHT UPPER QUADRANT COMPARISON:  None. FINDINGS: Gallbladder: Prior cholecystectomy Common bile duct: Diameter: Normal caliber, 5 mm Liver: No focal lesion identified. Within normal limits in parenchymal echogenicity. Portal vein is patent on color Doppler imaging with normal direction of blood flow towards the liver. Other: Small right pleural effusion. IMPRESSION: Prior cholecystectomy. No acute findings in the right upper quadrant. Small right pleural effusion. Electronically Signed   By: Rolm Baptise M.D.   On: 10/26/2020 12:26    Cardiac Studies   Echocardiogram 10/26/20:  1. Left ventricular ejection fraction, by estimation, is <20%. The left  ventricle has severely decreased function. The left ventricle demonstrates  global hypokinesis. The left ventricular internal cavity size was severely  dilated. Left ventricular  diastolic  parameters are consistent with Grade II diastolic dysfunction  (pseudonormalization). Elevated left atrial pressure.  2. Right ventricular systolic function is normal. The right ventricular  size is mildly enlarged. There is moderately elevated pulmonary artery  systolic pressure.  3. Left atrial size was severely dilated.  4. The mitral valve is normal in structure. Moderate to severe mitral  valve regurgitation. No evidence of mitral stenosis.  5. Tricuspid valve regurgitation is mild to moderate.  6. The aortic valve is tricuspid. Aortic valve regurgitation is mild.  Mild aortic valve sclerosis is present, with no evidence of aortic valve  stenosis.  7. Aortic dilatation noted. There is mild dilatation of the aortic root,  measuring 43 mm. There is mild dilatation of the ascending aorta,  measuring 44 mm.  8. The inferior vena cava is dilated in size with <50% respiratory  variability, suggesting right atrial pressure of 15 mmHg.   CTA PE protocol 10/25/20: IMPRESSION: 1. Severely limited examination demonstrating no central or lobar sized pulmonary embolism. Smaller segmental and subsegmental sized be entirely excluded pulmonary emboli cannot on the basis of today's examination. 2. Treatment related changes surrounding patient's right upper lobe lung cancer, presumably from prior radiation therapy, as above. 3. Mild cardiomegaly. 4. Aortic atherosclerosis, in addition to left main and 2 vessel coronary artery disease. Assessment for potential risk factor modification, dietary therapy or pharmacologic therapy may be warranted, if clinically indicated. 5. There are calcifications of the aortic valve. Echocardiographic correlation for evaluation of potential valvular dysfunction may be warranted if clinically indicated.  Patient Profile     70 y.o. male with a hx of chronic systolic and diastolic heart failure, nonischemic cardiomyopathy, lung mass s/p radiationwho is  being seen today for the evaluation  of acute heart failureat the request of Dr. Sloan Leiter.  Assessment & Plan    1. Acute on chronic combined CHF: patient has known history of NICM. He presented with SOB. CT PE study without overt PE though limited study due to respiratory motion. Echo this admission with EF <20% with G3DD, decreased RV function, and moderate-severe MR. He underwent a cardiac catheterization at [facility] 06/2019 with non-obstructive CAD. He was recommended for a cMR at that time, however was unable to afford the study. BNP on admission was 563. He was started on IV lasix 40mg  BID with UOP net -1.3L in the past 24 hours and -4.4L this admission. Weight (if accurate) is 157.4lbs, down from 188lbs on admission. Cr stable at 1.1 today, down from 1.68 on admission.  Hypotension has limited GDMT. SOB felt to be out of proportion to his volume overload - Will hold IV lasix and transition back to home po lasix starting tomorrow - Continue metoprolol succinate - Continue to monitor strict I&Os and daily weights  2. AKI: Cr 1.1 today after diuresis, down from 1.68 on admission with baseline 0.9.   3. Transaminitis: AST/ALT significantly elevated to 2410 and 1908 respectively on admission, both trending down. Felt LFT elevation could not be entirely contributed to by hepatic congestion from CHF. Home statin on hold. Acute hepatitis panel negative.  - Continue management per primary team  4. Non-obstructive CAD: patient with 30% LAD stenosis and 25% OM1 stenosis on [facility] 07/2019. Doubtful addition of aspirin would have any meaningful benefit.  - Resume statin once LFTs normalize  5. NSCLC: s/p radiation. Likely contributing to his SOB.  - Continue management per primary team and outpatient oncology.  6. Mitral regurgitation: moderate-severe on echo this admission.  - Continue routine monitoring and diuresis as above       For questions or updates, please contact [place] Please consult  [email or web address] for contact info under        Signed, Abigail Butts, PA-C  10/28/2020, 9:09 AM    Patient seen and examined and agree with Roby Lofts, PA-C as detailed above.  In brief, the patient is 70 year old male with hx of chronic systolic and diastolic heart failure, nonischemic cardiomyopathy, lung mass s/p radiationwho presented with progressive SOB with BNP 2346 concerning for acute on chronic heart failure exacerbation for which cardiology has been consulted.   Since admission, patient has been diuresed with lasix 40mg  IV BID with improvement in volume status. Unfortunately, he continues to have shortness of breath which is out-of-proportion to what would be expected from his volume status alone. Likely symptoms are multifactorial in nature due to HFrEF, lung cancer and radiation therapy.  TTE with LVEF <20% with diffuse WMA, G2DD (previously grade 3) moderate RV dysfunction and moderate-to-severe MR. Cath at [facility] without obstructive CAD. CTA limited but without PE.  GEN: Laying in bed, frail appearing, visibly dyspneic  Neck: No JVD Cardiac: Tachycardic, regular, no murmurs, rubs, or gallops.  Respiratory: Clear to auscultation bilaterally. Tachypneic GI: Soft, nontender, non-distended  MS: No edema; No deformity. Neuro:  Nonfocal  Psych: Normal affect   Plan: -Change to PO lasix tomorrow; need to be judicious with diuresis as patient has restrictive physiology and is preload dependent -Continue metop  -Unable to tolerate GDMT currently due to soft blood pressures; will add as able -Work-up of transaminitis per primary team; holding statin -SOB likely multifactorial in setting of HFrEF, radiation lung disease, and lung cancer as degree of symptoms out-of-proportion  to volume overload -May consider palliative care consult if patient's symptoms fail to improve despite improvement in volume status. Patient expressed interest in this today during my exam. Will discuss  further going forward.  Gwyndolyn Kaufman, MD

## 2020-10-28 NOTE — Consult Note (Signed)
Consultation Note Date: 10/28/2020   Patient Name: Philip Campbell  DOB: 1951/10/06  MRN: [id number]  Age / Sex: 70 y.o., male  PCP: Libby Maw, MD Referring Physician: Kathie Dike, MD  Reason for Consultation: Establishing goals of care  HPI/Patient Profile: 70 y.o. 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  He is currently admitted for exacerbation of chronic systolic HF with likely multifactorial dyspnea as it appears out of proportion to his volume status.   Philip Campbell is sitting on side of the bed reports continued shortness of breath with any exertion.  We discussed clinical course this admission and multiple comorbid conditions that he is currently facing. He reports understanding the severity of his situation but that he is invested in plan to continue with aggressive interventions. He wants to get out of the hospital and then follow-up with his oncologists for their opinion on his overall situation prior to making any other decisions about long-term goals of care.   NEXT OF KIN He denies having any immediate family. He had previously endorsed that his roommate, Winnifred Friar would be his surrogate decision maker and states this again today. He declined to fill out healthcare power of attorney paperwork.  SUMMARY OF RECOMMENDATIONS   -Full code/full scope -Philip Campbell reports understanding the severity of the illness. At the same time, he is invested in continued attempts at stabilization of function would like to work to get to a point of discharging and seeing his oncologist prior to further discussions regarding  long-term goals of care. He is open to any and all interventions that are offered. -No other palliative specific recommendations at this time. Please call if there are specific areas with which we can be of further assistance in the care of Philip Campbell.  Code Status/Advance Care Planning:  Full code   Symptom Management:   As above  Palliative Prophylaxis:   Delirium Protocol   Psycho-social/Spiritual:   Desire for further Chaplaincy support:yes  Additional Recommendations: Caregiving  Support/Resources  Prognosis:   Unable to determine  Discharge Planning: To Be Determined      Primary Diagnoses: Present on Admission: . Abnormal LFTs (liver function tests)   I have reviewed the medical record, interviewed the patient and family, and examined the patient. The following aspects are pertinent.  Past Medical History:  Diagnosis Date  . Chronic systolic CHF (congestive heart failure) ([place])   . Goals of care, counseling/discussion 04/23/2020   Social History   Socioeconomic History  . Marital status: Single    Spouse name: Not on file  . Number of children: Not on file  . Years of education: Not on file  . Highest education level: Not on file  Occupational History  . Not on file  Tobacco Use  .  Smoking status: Never Smoker  . Smokeless tobacco: Never Used  Vaping Use  . Vaping Use: Never used  Substance and Sexual Activity  . Alcohol use: Never  . Drug use: Never  . Sexual activity: Not on file  Other Topics Concern  . Not on file  Social History Narrative  . Not on file   Social Determinants of Health   Financial Resource Strain: Not on file  Food Insecurity: Not on file  Transportation Needs: Not on file  Physical Activity: Not on file  Stress: Not on file  Social Connections: Not on file   Family History  Problem Relation Age of Onset  . CAD Mother        CABG x3 in her 34's  . Alcoholism Mother   . Heart failure Neg Hx    Scheduled Meds: .  enoxaparin (LOVENOX) injection  40 mg Subcutaneous Q24H  . [START ON 10/29/2020] furosemide  20 mg Oral Daily  . metoprolol succinate  25 mg Oral Daily  . pantoprazole  40 mg Oral Daily  . sodium chloride flush  3 mL Intravenous Q12H   Continuous Infusions: . sodium chloride     PRN Meds:.sodium chloride, LORazepam, sodium chloride flush Medications Prior to Admission:  Prior to Admission medications   Medication Sig Start Date End Date Taking? Authorizing Provider  doxylamine, Sleep, (UNISOM) 25 MG tablet Take 50 mg by mouth at bedtime as needed for sleep.   Yes [provider]  furosemide (LASIX) 20 MG tablet Take 20 mg by mouth daily.  12/17/19  Yes [provider]  pantoprazole (PROTONIX) 40 MG tablet Take 40 mg by mouth daily. 12/08/19  Yes [provider]   No Known Allergies Review of Systems + shortness of breath + poor sleep  Physical Exam Patient admits to feeling fatigued even with minimal exertion Awake alert sitting on the side of the bed Reports shortness of breath, regular work of breathing S1-S2 mildly tachycardic, systolic murmur appreciated Abdomen is not distended nontender No edema Nonfocal  Vital Signs: BP 99/74 (BP Location: Left Arm)   Pulse 94   Temp (!) 97.5 F (36.4 C) (Oral)   Resp 20   Wt 71.4 kg Comment: reweighed to double check  SpO2 98%   BMI 21.95 kg/m  Pain Scale: 0-10   Pain Score: 0-No pain   SpO2: SpO2: 98 % O2 Device:SpO2: 98 % O2 Flow Rate: .O2 Flow Rate (L/min): 2 L/min  IO: Intake/output summary:   Intake/Output Summary (Last 24 hours) at 10/28/2020 1040 Last data filed at 10/28/2020 0600 Gross per 24 hour  Intake 540 ml  Output 1875 ml  Net -1335 ml    LBM: Last BM Date: 11/24/20 Baseline Weight: Weight: 85.3 kg Most recent weight: Weight: 71.4 kg (reweighed to double check)     Palliative Assessment/Data:   Flowsheet Rows   Flowsheet Row Most Recent Value  Intake Tab   Referral Department  Hospitalist  Unit at Time of Referral Med/Surg Unit  Palliative Care Primary Diagnosis Cancer  Date Notified 10/26/20  Palliative Care Type Return patient Palliative Care  Reason for referral Clarify Goals of Care  Date of Admission 10/25/20  Date first seen by Palliative Care 10/27/20  # of days Palliative referral response time 1 Day(s)  # of days IP prior to Palliative referral 1  Clinical Assessment   Palliative Performance Scale Score 50%  Psychosocial & Spiritual Assessment   Palliative Care Outcomes   Patient/Family meeting held? Yes  Who was at the meeting? patient    PPS 40%  Time In:  1800 Time Out:  1900 Time Total:  60 Greater than 50%  of this time was spent counseling and coordinating care related to the above assessment and plan.  Signed by: Micheline Rough, MD   Please contact Palliative Medicine Team phone at [phone] for questions and concerns.  For individual provider: See Shea Evans

## 2020-10-28 NOTE — Care Management Important Message (Signed)
Important Message  Patient Details IM Letter given to the Patient. Name: Sheppard Luckenbach Distel MRN: [id number] Date of Birth: 04-Jun-1951   Medicare Important Message Given:  Yes     Kerin Salen 10/28/2020, 9:19 AM

## 2020-10-28 NOTE — Progress Notes (Signed)
Progress Note    Philip Campbell  LFY:[id number] DOB: November 27, 1950  DOA: 10/25/2020 PCP: Libby Maw, MD      Brief Narrative:    Medical records reviewed and are as summarized below:  Philip Campbell is a 70 y.o. male  with history of chronic systolic congestive heart failure with known ejection fraction less than 20%, nonischemic cardiomyopathy, right upper lobe lung cancer resume non-small cell lung cancer status post radiation treatment and post radiation changes, recently lost to follow-up.  He presented to the hospital with increasing shortness of breath for about 2 months duration.  In the ED, BNP was 2,346 and chest x-ray was suggestive of CHF.  He was admitted to the hospital for acute exacerbation of chronic systolic CHF.   Assessment/Plan:   Principal Problem:   CHF exacerbation (HCC) Active Problems:   Abnormal LFTs (liver function tests)    Body mass index is 21.95 kg/m.     Shortness of breath, multifactorial.  This is probably due to combination of nonischemic cardiomyopathy, valvular heart disease, lung cancer and radiation induced lung damage.   Acute exacerbation of chronic combined CHF, nonischemic cardiomyopathy: 2D echo on 10/27/2019 showed EF less than 20% with grade 3 diastolic dysfunction.  Continue IV Lasix.  Cardiology following.  Continue Toprol.  He is intolerant to ACE-i or ARBs.     Elevated liver enzymes: Etiology is unclear but suspected to be due to CHF exacerbation.  Acute hepatitis panel was negative. RUQ ultrasound negative. Continue with diuresis  Acute hypoxic respiratory failure: Continue 2 L/min oxygen via nasal cannula.  Taper off oxygen as able.  Elevated INR: Improving.  Continue to monitor INR.   AKI: Creatinine is improving with diuresis.  Suspected to be due to cardiorenal syndrome.  Lung cancer, presumed non-small cell lung cancer: s/p stereotactic radiosurgery to RUL nodule. Follow-up with oncologist and  radiation oncologist at discharge   Urine drug screen positive for amphetamines and tetrahydrocannabinol.  However, patient said he does not use any illicit drugs.  He said he has been around people use marijuana.  Goals of care: Patient has been seen by palliative care. I had a discussion with the patient today around code status. He confirms that he would not want to receive heroic measures such as CPR or intubation. DNR orders has been placed. Will need to revisit further goals as his condition evolves.        Diet Order            Diet Heart Room service appropriate? Yes; Fluid consistency: Thin; Fluid restriction: 2000 mL Fluid  Diet effective now                    Consultants:  Cardiologist  Palliative care  Procedures:  None    Medications:   . enoxaparin (LOVENOX) injection  40 mg Subcutaneous Q24H  . furosemide  40 mg Oral Daily  . metoprolol succinate  25 mg Oral Daily  . pantoprazole  40 mg Oral Daily  . sodium chloride flush  3 mL Intravenous Q12H   Continuous Infusions: . sodium chloride       Anti-infectives (From admission, onward)   None             Family Communication/Anticipated D/C date and plan/Code Status   DVT prophylaxis: enoxaparin (LOVENOX) injection 40 mg Start: 10/25/20 2200     Code Status: DNR  Family Communication: Patient requested that his roommate be his main  decision maker, but did not want me to call him at this time to update him Disposition Plan:    Status is: Inpatient  Remains inpatient appropriate because:IV treatments appropriate due to intensity of illness or inability to take PO   Dispo: The patient is from: Home              Anticipated d/c is to: Home              Anticipated d/c date is: 2 days              Patient currently is not medically stable to d/c.     Subjective:   Continues to have shortness of breath, not back to baseline. Sitting up on side of bed.  Objective:     Vitals:   10/28/20 0459 10/28/20 0855 10/28/20 1409 10/28/20 1539  BP: 99/74   122/79  Pulse: 94   (!) 103  Resp:    15  Temp: (!) 97.5 F (36.4 C)   97.8 F (36.6 C)  TempSrc: Oral     SpO2: 98%   (!) 78%  Weight: 85.3 kg 71.4 kg    Height:   5\' 11"  (1.803 m)    No data found.   Intake/Output Summary (Last 24 hours) at 10/28/2020 1738 Last data filed at 10/28/2020 1534 Gross per 24 hour  Intake 1020 ml  Output 2275 ml  Net -1255 ml   Filed Weights   10/26/20 0511 10/28/20 0459 10/28/20 0855  Weight: 85.3 kg 85.3 kg 71.4 kg    Exam:  General exam: Alert, awake, oriented x 3 Respiratory system: coarse breath sounds bilaterally. Increased respiratory effort Cardiovascular system:RRR. No murmurs, rubs, gallops. Gastrointestinal system: Abdomen is nondistended, soft and nontender. No organomegaly or masses felt. Normal bowel sounds heard. Central nervous system: Alert and oriented. No focal neurological deficits. Extremities: No C/C/E, +pedal pulses Skin: No rashes, lesions or ulcers Psychiatry: Judgement and insight appear normal. Mood & affect appropriate.      Data Reviewed:   I have personally reviewed following labs and imaging studies:  Labs: Labs show the following:   Basic Metabolic Panel: Recent Labs  Lab 10/25/20 1247 10/26/20 0438 10/27/20 0455 10/28/20 0409  NA 133* 134* 140 140  K 4.3 3.6 2.9* 4.9  CL 97* 97* 100 99  CO2 19* 22 27 28   GLUCOSE 157* 80 90 119*  BUN 46* 45* 43* 39*  CREATININE 1.68* 1.27* 1.10 1.10  CALCIUM 8.7* 8.3* 8.3* 9.0  MG 1.9  --  2.2  --   PHOS  --   --  3.1  --    GFR Estimated Creatinine Clearance: 62.2 mL/min (by C-G formula based on SCr of 1.1 mg/dL). Liver Function Tests: Recent Labs  Lab 10/26/20 0438 10/26/20 1006 10/27/20 0455 10/28/20 0409  AST 2,419* 1,957* 921* 499*  ALT 1,898* 1,828* 1,357* 1,120*  ALKPHOS 154* 162* 164* 158*  BILITOT 3.6* 3.9* 2.9* 3.3*  PROT 6.4* 6.9 6.5 7.5  ALBUMIN 3.3*  3.5 3.3* 3.5   No results for input(s): LIPASE, AMYLASE in the last 168 hours. Recent Labs  Lab 10/26/20 1412 10/27/20 0455  AMMONIA 13 23   Coagulation profile Recent Labs  Lab 10/25/20 1247 10/26/20 1006 10/27/20 0455 10/28/20 0409  INR 2.2* 1.9* 1.6* 1.4*    CBC: Recent Labs  Lab 10/25/20 1247 10/26/20 0438 10/27/20 0455  WBC 10.9* 9.8 10.1  NEUTROABS  --   --  7.7  HGB 11.4* 11.9*  13.1  HCT 36.1* 35.9* 41.6  MCV 90.3 86.9 88.1  PLT 278 265 297   Cardiac Enzymes: No results for input(s): CKTOTAL, CKMB, CKMBINDEX, TROPONINI in the last 168 hours. BNP (last 3 results) Recent Labs    10/01/20 1441  PROBNP 563.0*   CBG: No results for input(s): GLUCAP in the last 168 hours. D-Dimer: No results for input(s): DDIMER in the last 72 hours. Hgb A1c: No results for input(s): HGBA1C in the last 72 hours. Lipid Profile: No results for input(s): CHOL, HDL, LDLCALC, TRIG, CHOLHDL, LDLDIRECT in the last 72 hours. Thyroid function studies: No results for input(s): TSH, T4TOTAL, T3FREE, THYROIDAB in the last 72 hours.  Invalid input(s): FREET3 Anemia work up: No results for input(s): VITAMINB12, FOLATE, FERRITIN, TIBC, IRON, RETICCTPCT in the last 72 hours. Sepsis Labs: Recent Labs  Lab 10/25/20 1247 10/26/20 0438 10/27/20 0455  WBC 10.9* 9.8 10.1    Microbiology Recent Results (from the past 240 hour(s))  Resp Panel by RT-PCR (Flu A&B, Covid) Nasopharyngeal Swab     Status: None   Collection Time: 10/25/20  2:20 PM   Specimen: Nasopharyngeal Swab; Nasopharyngeal(NP) swabs in vial transport medium  Result Value Ref Range Status   SARS Coronavirus 2 by RT PCR NEGATIVE NEGATIVE Final    Comment: (NOTE) SARS-CoV-2 target nucleic acids are NOT DETECTED.  The SARS-CoV-2 RNA is generally detectable in upper respiratory specimens during the acute phase of infection. The lowest concentration of SARS-CoV-2 viral copies this assay can detect is 138 copies/mL. A  negative result does not preclude SARS-Cov-2 infection and should not be used as the sole basis for treatment or other patient management decisions. A negative result may occur with  improper specimen collection/handling, submission of specimen other than nasopharyngeal swab, presence of viral mutation(s) within the areas targeted by this assay, and inadequate number of viral copies(<138 copies/mL). A negative result must be combined with clinical observations, patient history, and epidemiological information. The expected result is Negative.  Fact Sheet for Patients:  EntrepreneurPulse.com.au  Fact Sheet for Healthcare Providers:  IncredibleEmployment.be  This test is no t yet approved or cleared by the [place] FDA and  has been authorized for detection and/or diagnosis of SARS-CoV-2 by FDA under an Emergency Use Authorization (EUA). This EUA will remain  in effect (meaning this test can be used) for the duration of the COVID-19 declaration under Section 564(b)(1) of the Act, 21 U.S.C.section 360bbb-3(b)(1), unless the authorization is terminated  or revoked sooner.       Influenza A by PCR NEGATIVE NEGATIVE Final   Influenza B by PCR NEGATIVE NEGATIVE Final    Comment: (NOTE) The Xpert Xpress SARS-CoV-2/FLU/RSV plus assay is intended as an aid in the diagnosis of influenza from Nasopharyngeal swab specimens and should not be used as a sole basis for treatment. Nasal washings and aspirates are unacceptable for Xpert Xpress SARS-CoV-2/FLU/RSV testing.  Fact Sheet for Patients: EntrepreneurPulse.com.au  Fact Sheet for Healthcare Providers: IncredibleEmployment.be  This test is not yet approved or cleared by the [place] FDA and has been authorized for detection and/or diagnosis of SARS-CoV-2 by FDA under an Emergency Use Authorization (EUA). This EUA will remain in effect (meaning this test can  be used) for the duration of the COVID-19 declaration under Section 564(b)(1) of the Act, 21 U.S.C. section 360bbb-3(b)(1), unless the authorization is terminated or revoked.  Performed at [facility] [place] [address] [place], [place] [address]     Procedures and diagnostic studies:  No results found.             LOS: 3 days   [employer] on [email or web address]. If 7PM-7AM, please contact night-coverage at [email or web address]     10/28/2020, 5:38 PM

## 2020-10-29 ENCOUNTER — Telehealth: Payer: Self-pay | Admitting: Family Medicine

## 2020-10-29 DIAGNOSIS — I5043 Acute on chronic combined systolic (congestive) and diastolic (congestive) heart failure: Secondary | ICD-10-CM | POA: Diagnosis not present

## 2020-10-29 DIAGNOSIS — R627 Adult failure to thrive: Secondary | ICD-10-CM | POA: Diagnosis not present

## 2020-10-29 DIAGNOSIS — R7401 Elevation of levels of liver transaminase levels: Secondary | ICD-10-CM

## 2020-10-29 DIAGNOSIS — I5084 End stage heart failure: Secondary | ICD-10-CM | POA: Diagnosis not present

## 2020-10-29 DIAGNOSIS — Z66 Do not resuscitate: Secondary | ICD-10-CM | POA: Diagnosis not present

## 2020-10-29 DIAGNOSIS — Z515 Encounter for palliative care: Secondary | ICD-10-CM

## 2020-10-29 DIAGNOSIS — J9601 Acute respiratory failure with hypoxia: Secondary | ICD-10-CM | POA: Diagnosis not present

## 2020-10-29 DIAGNOSIS — I5023 Acute on chronic systolic (congestive) heart failure: Secondary | ICD-10-CM | POA: Diagnosis not present

## 2020-10-29 DIAGNOSIS — C3411 Malignant neoplasm of upper lobe, right bronchus or lung: Secondary | ICD-10-CM

## 2020-10-29 DIAGNOSIS — R0600 Dyspnea, unspecified: Secondary | ICD-10-CM | POA: Diagnosis not present

## 2020-10-29 DIAGNOSIS — E876 Hypokalemia: Secondary | ICD-10-CM

## 2020-10-29 DIAGNOSIS — R7989 Other specified abnormal findings of blood chemistry: Secondary | ICD-10-CM | POA: Diagnosis not present

## 2020-10-29 DIAGNOSIS — Z79899 Other long term (current) drug therapy: Secondary | ICD-10-CM

## 2020-10-29 DIAGNOSIS — D689 Coagulation defect, unspecified: Secondary | ICD-10-CM

## 2020-10-29 LAB — COMPREHENSIVE METABOLIC PANEL
ALT: 726 U/L — ABNORMAL HIGH (ref 0–44)
AST: 255 U/L — ABNORMAL HIGH (ref 15–41)
Albumin: 3.2 g/dL — ABNORMAL LOW (ref 3.5–5.0)
Alkaline Phosphatase: 122 U/L (ref 38–126)
Anion gap: 12 (ref 5–15)
BUN: 34 mg/dL — ABNORMAL HIGH (ref 8–23)
CO2: 26 mmol/L (ref 22–32)
Calcium: 8.2 mg/dL — ABNORMAL LOW (ref 8.9–10.3)
Chloride: 97 mmol/L — ABNORMAL LOW (ref 98–111)
Creatinine, Ser: 0.85 mg/dL (ref 0.61–1.24)
GFR, Estimated: >60 mL/min (ref >60)
Glucose, Bld: 112 mg/dL — ABNORMAL HIGH (ref 70–99)
Potassium: 3.2 mmol/L — ABNORMAL LOW (ref 3.5–5.1)
Sodium: 135 mmol/L (ref 135–145)
Total Bilirubin: 3.2 mg/dL — ABNORMAL HIGH (ref 0.3–1.2)
Total Protein: 6.6 g/dL (ref 6.5–8.1)

## 2020-10-29 MED ORDER — POTASSIUM CHLORIDE CRYS ER 20 MEQ PO TBCR
40.0000 meq | EXTENDED_RELEASE_TABLET | Freq: Once | ORAL | Status: AC
  Administered 2020-10-29: 40 meq via ORAL
  Filled 2020-10-29: qty 2

## 2020-10-29 MED ORDER — MORPHINE SULFATE (PF) 2 MG/ML IV SOLN
0.5000 mg | Freq: Once | INTRAVENOUS | Status: AC
  Administered 2020-10-29: 0.5 mg via INTRAVENOUS
  Filled 2020-10-29: qty 1

## 2020-10-29 MED ORDER — FUROSEMIDE 20 MG PO TABS: 40.0000 mg | ORAL_TABLET | Freq: Every day | ORAL | 0 refills | Status: AC

## 2020-10-29 MED ORDER — MORPHINE SULFATE (PF) 2 MG/ML IV SOLN: 1.0000 mg | Freq: Once | INTRAVENOUS | Status: DC

## 2020-10-29 NOTE — Plan of Care (Signed)

## 2020-10-29 NOTE — Progress Notes (Signed)
Pt alert and aware sitting on the edge of bed with head on the tray table. PT STATES HE WANTS TO GO HOME.  He states his ability to wait is wearing thin.  The chaplain offered caring presence and listening ear. Pt was told that the chaplain would be praying for him.
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
## 2020-10-29 NOTE — Evaluation (Signed)
Physical Therapy One Time Evaluation Patient Details Name: Philip Campbell MRN: [id number] DOB: 11/05/1950 Today's Date: 10/29/2020   History of Present Illness  Pt is a 70 year old with history of chronic systolic congestive heart failure with known ejection fraction less than 20%, nonischemic cardiomyopathy, right upper lobe lung cancer resume non-small cell lung cancer status post radiation treatment and post radiation changes, recently lost to follow-up.  Pt admitted to the hospital for acute exacerbation of chronic systolic CHF  Clinical Impression  Patient evaluated by Physical Therapy with no further acute PT needs identified. All education has been completed and the patient has no further questions.  See below for any follow-up Physical Therapy or equipment needs. PT is signing off. Thank you for this referral.  Pt ambulated in hallway and required supplemental oxygen (see below).  Pt impulsive and continually talking about d/c home today.  Explained need for supplemental oxygen.  Pt reports he will be able to function at home however appears weak, deconditioned and SOB just sitting at EOB today.  Pt will likely need assist for ADLs upon d/c.  Pt states he just wants to go home.  Plan is for hospice at home however if no hospice then pt would benefit from [facility] services.    Follow Up Recommendations Home health PT (if not home with hospice)    Equipment Recommendations  None recommended by PT    Recommendations for Other Services       Precautions / Restrictions Precautions Precautions: Fall Precaution Comments: monitor sats      Mobility  Bed Mobility               General bed mobility comments: sitting EOB on arrival; SPO2 99% on room air at rest    Transfers Overall transfer level: Needs assistance Equipment used: Rolling walker (2 wheeled) Transfers: Sit to/from Stand Sit to Stand: Min guard         General transfer comment: min/guard for  safety  Ambulation/Gait Ambulation/Gait assistance: Min guard Gait Distance (Feet): 120 Feet Assistive device: Rolling walker (2 wheeled) Gait Pattern/deviations: Step-through pattern;Decreased stride length;Trunk flexed     General Gait Details: verbal cues for RW positioning and posture, SPO2 dropped to 76% on room air, improved to 86% on 2L and 99% on 3L O2 [place] upon return to bed  Stairs            Wheelchair Mobility    Modified Rankin (Stroke Patients Only)       Balance Overall balance assessment: Mild deficits observed, not formally tested                                           Pertinent Vitals/Pain Pain Assessment: No/denies pain    Home Living Family/patient expects to be discharged to:: Private residence Living Arrangements: Non-relatives/Friends     Home Access: Level entry     Home Layout: One level Home Equipment: Environmental consultant - 2 wheels      Prior Function Level of Independence: Independent               Blaize Dominance        Extremity/Trunk Assessment        Lower Extremity Assessment Lower Extremity Assessment: Generalized weakness       Communication   Communication: No difficulties  Cognition Arousal/Alertness: Awake/alert Behavior During Therapy: Anxious;Impulsive  General Comments: poor insight into his medical condition and capabilities      General Comments      Exercises     Assessment/Plan    PT Assessment Patent does not need any further PT services  PT Problem List         PT Treatment Interventions      PT Goals (Current goals can be found in the Care Plan section)  Acute Rehab PT Goals PT Goal Formulation: All assessment and education complete, DC therapy    Frequency     Barriers to discharge        Co-evaluation               AM-PAC PT "6 Clicks" Mobility  Outcome Measure Help needed turning from your back to your  side while in a flat bed without using bedrails?: None Help needed moving from lying on your back to sitting on the side of a flat bed without using bedrails?: None Help needed moving to and from a bed to a chair (including a wheelchair)?: A Little Help needed standing up from a chair using your arms (e.g., wheelchair or bedside chair)?: A Little Help needed to walk in hospital room?: A Little Help needed climbing 3-5 steps with a railing? : A Lot 6 Click Score: 19    End of Session Equipment Utilized During Treatment: Oxygen Activity Tolerance: Patient limited by fatigue Patient left: in bed;with call bell/phone within reach Nurse Communication: Mobility status PT Visit Diagnosis: Difficulty in walking, not elsewhere classified (R26.2)    Time: 1210-1240 PT Time Calculation (min) (ACUTE ONLY): 30 min   Charges:   PT Evaluation $PT Eval Low Complexity: 1 Low PT Treatments $Gait Training: 8-22 mins      Jannette Spanner PT, DPT Acute Rehabilitation Services Pager: [phone] Office: [phone] Mouhamed Glassco,KATHrine E 10/29/2020, 1:03 PM

## 2020-10-29 NOTE — TOC Progression Note (Signed)
Transition of Care [facility]) - Progression Note    Patient Details  Name: Philip Campbell MRN: [id number] Date of Birth: [birth year]  [facility] [place] CM/SW Contact  Purcell Mouton, RN Phone Number: 10/29/2020, 2:42 PM  Clinical Narrative:     Pt is setup with [place]. Audrea Muscat, RN with [facility] has ordered the Oxygen for discharge and for at home. Manual pt's friend at bedside will take pt home. TOC signing out.   Expected Discharge Plan: Home w Hospice Care Barriers to Discharge: No Barriers Identified  Expected Discharge Plan and Services Expected Discharge Plan: [place] arrangements for the past 2 months: Single Family Home Expected Discharge Date: 10/29/20                                     Social Determinants of Health (SDOH) Interventions    Readmission Risk Interventions No flowsheet data found.

## 2020-10-29 NOTE — Progress Notes (Signed)
Spoke to Case Management earlier in the day around 1330 inquiring about equipment arrangement for discharge. Pt will d/c with Hospice and home equipment will be arranged by TOC. Cookie-case management contacts Hospice to arrange for equipment delivery then update the primary RN of final arrangements. Pt waited for 3 hrs for O2 delivery and became very restless and agitated. Morphine ,5 mg administer per md order and Ativan .5mg  given as ordered. Pt remain somewhat restless caretaker Audelia Hives at bedside with pt, reviewed discharge AVS with pt and caretaker. Acknowledged understanding. O2 finally arrived at 1600 and pt able to demonstrate use of equipment. Pt discharged safely with equipment at bedside and home arrangement confirmed. SRP, RN

## 2020-10-29 NOTE — Progress Notes (Addendum)
Progress Note  Patient Name: Philip Campbell Date of Encounter: 10/29/2020  [facility] Cardiologist: Donato Heinz, MD   Subjective   Wants to go home.  Still wearing 02   Inpatient Medications    Scheduled Meds: . enoxaparin (LOVENOX) injection  40 mg Subcutaneous Q24H  . furosemide  40 mg Oral Daily  . metoprolol succinate  25 mg Oral Daily  . pantoprazole  40 mg Oral Daily  . sodium chloride flush  3 mL Intravenous Q12H   Continuous Infusions: . sodium chloride     PRN Meds: sodium chloride, LORazepam, sodium chloride flush   Vital Signs    Vitals:   10/28/20 1539 10/28/20 2040 10/29/20 0502 10/29/20 0824  BP: 122/79 (!) 88/60 101/79   Pulse: (!) 103 94 (!) 108   Resp: 15 14 20    Temp: 97.8 F (36.6 C) 98.3 F (36.8 C) 97.6 F (36.4 C)   TempSrc:  Oral Oral   SpO2: (!) 78% 96% 97%   Weight:   85.3 kg 71.1 kg  Height:        Intake/Output Summary (Last 24 hours) at 10/29/2020 1032 Last data filed at 10/29/2020 0836 Gross per 24 hour  Intake 840 ml  Output 1470 ml  Net -630 ml   Last 3 Weights 10/29/2020 10/29/2020 10/28/2020  Weight (lbs) 156 lb 12 oz 188 lb 0.8 oz 157 lb 6.5 oz  Weight (kg) 71.1 kg 85.3 kg 71.4 kg      Telemetry    SR to ST  - Personally Reviewed  ECG    No new - Personally Reviewed  Physical Exam   GEN: No acute distress.  Frustrated, more he talks more dyspnea  Neck: No JVD Cardiac: RRR, no murmurs, rubs, or gallops.  Respiratory: Clear to auscultation bilaterally. GI: Soft, nontender, non-distended  MS: No edema; No deformity. Neuro:  Nonfocal  Psych: Normal affect   Labs    High Sensitivity Troponin:  No results for input(s): TROPONINIHS in the last 720 hours.    Chemistry Recent Labs  Lab 10/27/20 0455 10/28/20 0409 10/29/20 0608  NA 140 140 135  K 2.9* 4.9 3.2*  CL 100 99 97*  CO2 27 28 26   GLUCOSE 90 119* 112*  BUN 43* 39* 34*  CREATININE 1.10 1.10 0.85  CALCIUM 8.3* 9.0 8.2*  PROT 6.5 7.5 6.6   ALBUMIN 3.3* 3.5 3.2*  AST 921* 499* 255*  ALT 1,357* 1,120* 726*  ALKPHOS 164* 158* 122  BILITOT 2.9* 3.3* 3.2*  GFRNONAA >60 >60 >60  ANIONGAP 13 13 12      Hematology Recent Labs  Lab 10/25/20 1247 10/26/20 0438 10/27/20 0455  WBC 10.9* 9.8 10.1  RBC 4.00* 4.13* 4.72  HGB 11.4* 11.9* 13.1  HCT 36.1* 35.9* 41.6  MCV 90.3 86.9 88.1  MCH 28.5 28.8 27.8  MCHC 31.6 33.1 31.5  RDW 15.1 15.2 15.2  PLT 278 265 297    BNP Recent Labs  Lab 10/25/20 1335  BNP 2,346.9*     DDimer No results for input(s): DDIMER in the last 168 hours.   Radiology    No results found.  Cardiac Studies   Echocardiogram1/2/22:  1. Left ventricular ejection fraction, by estimation, is <20%. The left  ventricle has severely decreased function. The left ventricle demonstrates  global hypokinesis. The left ventricular internal cavity size was severely  dilated. Left ventricular  diastolic parameters are consistent with Grade II diastolic dysfunction  (pseudonormalization). Elevated left atrial pressure.  2. Right ventricular systolic function is normal. The right ventricular  size is mildly enlarged. There is moderately elevated pulmonary artery  systolic pressure.  3. Left atrial size was severely dilated.  4. The mitral valve is normal in structure. Moderate to severe mitral  valve regurgitation. No evidence of mitral stenosis.  5. Tricuspid valve regurgitation is mild to moderate.  6. The aortic valve is tricuspid. Aortic valve regurgitation is mild.  Mild aortic valve sclerosis is present, with no evidence of aortic valve  stenosis.  7. Aortic dilatation noted. There is mild dilatation of the aortic root,  measuring 43 mm. There is mild dilatation of the ascending aorta,  measuring 44 mm.  8. The inferior vena cava is dilated in size with <50% respiratory  variability, suggesting right atrial pressure of 15 mmHg.   CTA PE protocol 10/25/20: IMPRESSION: 1. Severely  limited examination demonstrating no central or lobar sized pulmonary embolism. Smaller segmental and subsegmental sized be entirely excluded pulmonary emboli cannot on the basis of today's examination. 2. Treatment related changes surrounding patient's right upper lobe lung cancer, presumably from prior radiation therapy, as above. 3. Mild cardiomegaly. 4. Aortic atherosclerosis, in addition to left main and 2 vessel coronary artery disease. Assessment for potential risk factor modification, dietary therapy or pharmacologic therapy may be warranted, if clinically indicated. 5. There are calcifications of the aortic valve. Echocardiographic correlation for evaluation of potential valvular dysfunction may be warranted if clinically indicated.   Patient Profile     70 y.o. male with a hx of chronic systolic and diastolic heart failure, nonischemic cardiomyopathy, lung mass s/p radiationnow followed for CHF.   Assessment & Plan   1. Acute on chronic combined CHF: patient has known history of NICM. He presented with SOB. CT PE study without overt PE though limited study due to respiratory motion. Echo this admission with EF <20% with G3DD, decreased RV function, and moderate-severe MR. He underwent a cardiac catheterization at [facility] 06/2019 with non-obstructive CAD. He was recommended for a cMR at that time, however was unable to afford the study. BNP on admission was 563. He was started on IV lasix 40mg  BID with UOP net neg 5190 snce admit and wt down from eight (if accurate) is 156.42lbs, down from 188lbs on admission. Cr stable at 0.85 today, down from 1.68 on admission.  Hypotension has limited GDMT. SOB felt to be out of proportion to his volume overload - held IV lasix and back to home po lasix today - Continue metoprolol succinate - Continue to monitor strict I&Os and daily weights  2. AKI: Cr 0.8 today after diuresis, down from 1.68 on admission with baseline 0.9.   3.  Transaminitis: AST/ALT significantly elevated to 2410 and 1908 respectively on admission, both trending down. Felt LFT elevation could not be entirely contributed to by hepatic congestion from CHF. Home statin on hold. Acute hepatitis panel negative.  - Continue management per primary team  4. 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 Doubtful addition of aspirin would have any meaningful benefit.  - Resume statin once LFTs normalize  5. NSCLC: s/p radiation. Likely contributing to his SOB.  - Continue management per primary team and outpatient oncology.  Dr. Marin Olp saw today.   6. Mitral regurgitation: moderate-severe on echo this admission.  - Continue routine monitoring and diuresis as above  7.  Hypokalemia  IM is replacing.        For questions or updates,  please contact [place] Please consult [email or web address] for contact info under        Signed, Cecilie Kicks, NP  10/29/2020, 10:32 AM    Patient seen and examined and agree with Cecilie Kicks, NP as detailed above.  In brief, the patient is 70 year old male withhx of chronic systolic and diastolic heart failure, nonischemic cardiomyopathy, lung mass s/p radiationwho presented with progressive SOB with BNP 2346 concerning for acute on chronic heart failure exacerbation for which cardiology has been consulted.   Since admission, patient has been diuresed with lasix 40mg  IV BID with improvement in volume status. Unfortunately, he continues to have shortness of breath which is out-of-proportion to what would be expected from his volume status alone. Likely symptoms are multifactorial in nature due to low output HF and radiation therapy.  TTE with LVEF <20% with diffuse WMA,G2DD (previously grade 3)moderate RV dysfunction and moderate-to-severe MR. Cath at [facility] without obstructive CAD. CTA limited but without PE.  Patient insistent about going home today. Has decided he  wants palliative care and would like to transition code status to DNR/DNI. Overall, prognosis is very poor from a cardiac standpoint and patient is unable to tolerate GDMT due to hypotension. He is end stage heart failure with limited options. Agree with hospice care at this time.  Exam: JKQ:ASUORVI up in bed, asking to go home Neck:No JVD Cardiac:Tachycardic, regular, no murmurs, rubs, or gallops.  Respiratory:Clear to auscultation bilaterally. Tachypneic FB:PPHK, nontender, non-distended  MS:No edema; No deformity. Neuro:Nonfocal  Psych: Normal affect   Plan: -Agree with palliative care evaluation and hospice care on discharge -Continue metop 25mg  and lasix 40mg  PO daily on discharge -Unable to tolerate GDMT due to hypotension -Patient understands his overall very poor prognosis and limited options; he would like to proceed with hospice and avoid further intervention or extend the duration of his hospitalization  Gwyndolyn Kaufman, MD

## 2020-10-29 NOTE — Progress Notes (Signed)
Pt anxious and restless, wanting to go home. Morphine ordered for pt. SRP, RN

## 2020-10-29 NOTE — Progress Notes (Addendum)
Manufacturing engineer [facility]) Hospital Liaison: RN note     Notified by Transition of [place] RN of patient/family request for [facility] services at home after discharge. Chart and patient information under review by [facility] physician. Hospice eligibility pending currently.     Writer spoke with patient and friend  to initiate education related to hospice philosophy, services and team approach to care.  Patient and Manual verbalized understanding of information given. Per discussion, plan is for discharge to home by  Private car.   Please send signed and completed DNR form home with patient/family. Patient will need prescriptions for discharge comfort medications.      DME needs have been discussed, patient currently has the following equipment in the home: none.  Patient/family requests the following DME for delivery to the home:  Oxygent for home and a portable tank to be delivered to room at hospital. [facility] equipment manager has been notified and will contact DME provider to arrange delivery to the home. Home address has been verified and is correct in the chart.               Manual is the family member to contact to arrange time of delivery.      [facility] aware of the above. Please notify [facility] when patient is ready to leave the unit at discharge. (Call [phone] or [phone] after 5pm.) [facility] information and contact numbers given to Manual.      Thank you,    Farrel Gordon, RN, [facility]       [place] (listed on [facility] under [place])     [phone]

## 2020-10-29 NOTE — Telephone Encounter (Signed)
Caller Name: Tammy w/[facility] Call back phone #: [phone]  Reason for Call: will pcp be hospice attending and agree to terminal illness of lung cancer? Please call to advise as pt is being discharged from the hospital today

## 2020-10-29 NOTE — TOC Progression Note (Addendum)
Transition of Care [facility]) - Progression Note    Patient Details  Name: Philip Campbell MRN: [id number] Date of Birth: [birth year]  [facility]) CM/SW Contact  Purcell Mouton, RN Phone Number: 10/29/2020, 1:15 PM  Clinical Narrative:    Spoke with Manual at bedside concerning hospice. [facility] was selected. Referral was given to in house rep Audrea Muscat, RN with [facility].    Expected Discharge Plan: Home w Hospice Care Barriers to Discharge: No Barriers Identified  Expected Discharge Plan and Services Expected Discharge Plan: [place] arrangements for the past 2 months: Single Family Home Expected Discharge Date: 10/29/20                                     Social Determinants of Health (SDOH) Interventions    Readmission Risk Interventions No flowsheet data found.

## 2020-10-29 NOTE — Discharge Summary (Signed)
Discharge Summary  Philip Campbell AOZ:[id number] DOB: 1951-09-01  PCP: Libby Maw, MD  Admit date: 10/25/2020 Discharge date: 10/29/2020  Time spent: 47mins, more than 50% time spent on coordination of care.   Recommendations for Outpatient Follow-up:  Home with home hospice, home o2 F/u with PCP and cardiology as needed  Discharge Diagnoses:  Active Hospital Problems   Diagnosis Date Noted  . CHF exacerbation ([place]) 10/25/2020  . Dyspnea   . Palliative care by specialist   . Abnormal LFTs (liver function tests) 10/26/2020    Resolved Hospital Problems  No resolved problems to display.    Discharge Condition: stable  Diet recommendation: heart healthy  Filed Weights   10/28/20 0855 10/29/20 0502 10/29/20 0824  Weight: 71.4 kg 85.3 kg 71.1 kg    History of present illness: ( per admitting MD Dr Darrick Meigs) Philip Campbell  is a 70 y.o. male, with history chronic systolic CHF, EF 96% as of April 2021,  right upper lung nodule, presumed non-small cell lung cancer, status post radiation treatment 4 months ago, who was recently seen by radiation oncology on 10/20/2020, at that time he was found to be tachycardic and was recommended to go to ED for further evaluation.  Patient refused to come to the ED.  As per patient he has been getting short of breath for past few months.  Denies coughing up any phlegm.  Denies fever or chills.  He has not been compliant with his Lasix.  He denies nausea vomiting or diarrhea.  Denies any chest pain. In the ED, patient was found to have BNP elevated to 2346.9.  Chest x-ray showed mild congestive heart failure.  He is currently not requiring oxygen. Patient received Lasix 60 mg IV x1. Denies dysuria or abdominal pain  Hospital Course:  Principal Problem:   CHF exacerbation ([facility]) Active Problems:   Abnormal LFTs (liver function tests)   Dyspnea   Palliative care by specialist  Acute hypoxic respiratory failure, likely multifactorial  including acute on chronic combined CHF, valvular disease, lung cancer and radiation induced lung damage. Prognosis is poor, palliative care consulted, patient decided to go home with home hospice Home oxygen arranged  Acute on chronic combined CHF -Cardiology input appreciated, agreed discharge home with metoprolol and Lasix.  Patient is not able to tolerate GDMT due to hypotension, per cardiology "he is end-stage heart failure with limited option, agree with hospice care at this time"  Hypokalemia, replaced  Transaminitis, likely liver congestion in the setting of acute heart failure, acute hepatitis panel negative, right upper quadrant ultrasound negative Improved with diuresis Statin discontinued  Coagulopathy, likely due to liver congestion, improved  AKI on CKD 2, likely from acute heart failure, improved   nodule in the right upper lung. Per oncology " He did have a history of tobacco use.  We cannot get a biopsy but the PET scan it was positive for activity.  He went ahead and had radiosurgery by Radiation Oncology."  Urine drug screen positive for amphetamines and tetrahydrocannabinol.  However, patient said he does not use any illicit drugs.  He said he has been around people use marijuana  CODE STATUS: DNR Room mate updated at bedside with permission  Procedures:  None  Consultations:  Cardiology  Oncology  Palliative care  Discharge Exam: BP (!) 112/99 (BP Location: Right Arm)   Pulse 94   Temp 98.5 F (36.9 C)   Resp (!) 24   Ht 5\' 11"  (1.803 m)   Wt  71.1 kg Comment: rechecked standing on same scale as previous day  SpO2 100%   BMI 21.86 kg/m   General: Frail, thin, chronically ill-appearing Cardiovascular: RRR Respiratory: Dyspnea with exertion  Discharge Instructions You were cared for by a hospitalist during your hospital stay. If you have any questions about your discharge medications or the care you received while you were in the hospital after  you are discharged, you can call the unit and asked to speak with the hospitalist on call if the hospitalist that took care of you is not available. Once you are discharged, your primary care physician will handle any further medical issues. Please note that NO REFILLS for any discharge medications will be authorized once you are discharged, as it is imperative that you return to your primary care physician (or establish a relationship with a primary care physician if you do not have one) for your aftercare needs so that they can reassess your need for medications and monitor your lab values.  Discharge Instructions    Diet - low sodium heart healthy   Complete by: As directed    Increase activity slowly   Complete by: As directed      Allergies as of 10/29/2020   No Known Allergies     Medication List    STOP taking these medications   atorvastatin 20 MG tablet Commonly known as: LIPITOR     TAKE these medications   furosemide 20 MG tablet Commonly known as: LASIX Take 2 tablets (40 mg total) by mouth daily. What changed: how much to take   MAGNESIUM PO Take 1 tablet by mouth daily.   metoprolol succinate 25 MG 24 hr tablet Commonly known as: TOPROL-XL Take 1 tablet (25 mg total) by mouth daily.   pantoprazole 40 MG tablet Commonly known as: PROTONIX Take 1 tablet (40 mg total) by mouth daily.   ZINC PO Take 1 tablet by mouth daily. What changed: Another medication with the same name was removed. Continue taking this medication, and follow the directions you see here.            Durable Medical Equipment  (From admission, onward)         Start     Ordered   10/29/20 1201  DME Oxygen  Once       Question Answer Comment  Length of Need Lifetime   Mode or (Route) Nasal cannula   Liters per Minute 2   Frequency Continuous (stationary and portable oxygen unit needed)   Oxygen conserving device Yes   Oxygen delivery system Gas      10/29/20 1200         No  Known Allergies  Follow-up Information    [facility] Follow up.   Why: The above will follow you at home with Hospice. Please call if you have any question or concerns the above number.  Contact information: [place] [address] [phone]       Libby Maw, MD Follow up.   Specialty: Family Medicine Why: as needed Contact information: [place] [address] [phone]        Donato Heinz, MD Follow up.   Specialties: Cardiology, Radiology Why: as needed Contact information: [address] [place] [address] [phone]                The results of significant diagnostics from this hospitalization (including imaging, microbiology, ancillary and laboratory) are listed below  for reference.    Significant Diagnostic Studies: DG Chest 2 View  Result Date: 10/25/2020 CLINICAL DATA:  Heart from earlier, shortness of breath. EXAM: CHEST - 2 VIEW COMPARISON:  Mar 19, 2020 FINDINGS: The mediastinal contour is normal. Heart size is enlarged. Right upper lobe mass is more prominent compared to prior chest x-ray. Increased pulmonary interstitium is identified in the left lung. IMPRESSION: 1. Right upper lobe mass is more prominent compared to prior chest x-ray. 2. Mild congestive heart failure. Electronically Signed   By: Abelardo Diesel M.D.   On: 10/25/2020 12:44   CT Angio Chest PE W and/or Wo Contrast  Result Date: 10/25/2020 CLINICAL DATA:  70 year old male with suspected pulmonary embolism. Congestive heart failure and possible pneumonia. Evaluate for pulmonary embolism. EXAM: CT ANGIOGRAPHY CHEST WITH CONTRAST TECHNIQUE: Multidetector CT imaging of the chest was performed using the standard protocol during bolus administration of intravenous contrast. Multiplanar CT image reconstructions and MIPs were obtained to evaluate the vascular anatomy. CONTRAST:  65mL OMNIPAQUE  IOHEXOL 350 MG/ML SOLN COMPARISON:  Chest CT 01/23/2020. FINDINGS: Comment: Today's study is limited by extensive patient respiratory motion. Cardiovascular: No definite central or lobar filling defects to suggest large pulmonary embolism. Unfortunately, smaller segmental and subsegmental sized of filling defects cannot be entirely excluded on the basis of today's motion limited examination. Heart size is mildly enlarged. There is no significant pericardial fluid, thickening or pericardial calcification. There is aortic atherosclerosis, as well as atherosclerosis of the great vessels of the mediastinum and the coronary arteries, including calcified atherosclerotic plaque in the left main, left anterior descending and left circumflex coronary arteries. Mild calcifications of the aortic valve. Mediastinum/Nodes: No pathologically enlarged mediastinal or hilar lymph nodes. Esophagus is unremarkable in appearance. No axillary lymphadenopathy. Lungs/Pleura: Previously noted right upper lobe pulmonary nodule is again noted present measuring approximately 2.7 x 2.2 cm, with new areas of surrounding architectural distortion, volume loss, ground-glass attenuation and septal thickening with extensive architectural distortion, most compatible with evolving postradiation changes. No pleural effusions. Upper Abdomen: Aortic atherosclerosis.  Status post cholecystectomy. Musculoskeletal: There are no aggressive appearing lytic or blastic lesions noted in the visualized portions of the skeleton. Review of the MIP images confirms the above findings. IMPRESSION: 1. Severely limited examination demonstrating no central or lobar sized pulmonary embolism. Smaller segmental and subsegmental sized be entirely excluded pulmonary emboli cannot on the basis of today's examination. 2. Treatment related changes surrounding patient's right upper lobe lung cancer, presumably from prior radiation therapy, as above. 3. Mild cardiomegaly. 4.  Aortic atherosclerosis, in addition to left main and 2 vessel coronary artery disease. Assessment for potential risk factor modification, dietary therapy or pharmacologic therapy may be warranted, if clinically indicated. 5. There are calcifications of the aortic valve. Echocardiographic correlation for evaluation of potential valvular dysfunction may be warranted if clinically indicated. Aortic Atherosclerosis (ICD10-I70.0). Electronically Signed   By: Vinnie Langton M.D.   On: 10/25/2020 15:31   ECHOCARDIOGRAM COMPLETE  Result Date: 10/26/2020    ECHOCARDIOGRAM REPORT   Patient Name:   HARMON BOMMARITO Cilia Date of Exam: 10/26/2020 Medical Rec #:  [id number]        Height:       71.0 in Accession #:    [id number]       Weight:       188.1 lb Date of Birth:  Dec 08, 1950       BSA:          2.054 m Patient Age:  69 years         BP:           107/85 mmHg Patient Gender: M                HR:           92 bpm. Exam Location:  Inpatient Procedure: 2D Echo, 3D Echo, Color Doppler and Cardiac Doppler Indications:     Cardiomyopathy-Unspecified I42.9  History:         Patient has prior history of Echocardiogram examinations, most                  recent 01/24/2020. CHF; Arrythmias:Tachycardia. Lung cancer.  Sonographer:     Darlina Sicilian RDCS Referring Phys:  [id number] Barb Merino Diagnosing Phys: Kirk Ruths MD IMPRESSIONS  1. Left ventricular ejection fraction, by estimation, is <20%. The left ventricle has severely decreased function. The left ventricle demonstrates global hypokinesis. The left ventricular internal cavity size was severely dilated. Left ventricular diastolic parameters are consistent with Grade II diastolic dysfunction (pseudonormalization). Elevated left atrial pressure.  2. Right ventricular systolic function is normal. The right ventricular size is mildly enlarged. There is moderately elevated pulmonary artery systolic pressure.  3. Left atrial size was severely dilated.  4. The mitral valve is  normal in structure. Moderate to severe mitral valve regurgitation. No evidence of mitral stenosis.  5. Tricuspid valve regurgitation is mild to moderate.  6. The aortic valve is tricuspid. Aortic valve regurgitation is mild. Mild aortic valve sclerosis is present, with no evidence of aortic valve stenosis.  7. Aortic dilatation noted. There is mild dilatation of the aortic root, measuring 43 mm. There is mild dilatation of the ascending aorta, measuring 44 mm.  8. The inferior vena cava is dilated in size with <50% respiratory variability, suggesting right atrial pressure of 15 mmHg. FINDINGS  Left Ventricle: Left ventricular ejection fraction, by estimation, is <20%. The left ventricle has severely decreased function. The left ventricle demonstrates global hypokinesis. The left ventricular internal cavity size was severely dilated. There is no left ventricular hypertrophy. Left ventricular diastolic parameters are consistent with Grade II diastolic dysfunction (pseudonormalization). Elevated left atrial pressure. Right Ventricle: The right ventricular size is mildly enlarged. Right ventricular systolic function is normal. There is moderately elevated pulmonary artery systolic pressure. The tricuspid regurgitant velocity is 3.01 m/s, and with an assumed right atrial pressure of 15 mmHg, the estimated right ventricular systolic pressure is 85.2 mmHg. Left Atrium: Left atrial size was severely dilated. Right Atrium: Right atrial size was normal in size. Pericardium: There is no evidence of pericardial effusion. Mitral Valve: The mitral valve is normal in structure. Moderate to severe mitral valve regurgitation. No evidence of mitral valve stenosis. MV peak gradient, 7.2 mmHg. The mean mitral valve gradient is 4.0 mmHg. Tricuspid Valve: The tricuspid valve is normal in structure. Tricuspid valve regurgitation is mild to moderate. No evidence of tricuspid stenosis. Aortic Valve: The aortic valve is tricuspid. Aortic  valve regurgitation is mild. Aortic regurgitation PHT measures 332 msec. Mild aortic valve sclerosis is present, with no evidence of aortic valve stenosis. Pulmonic Valve: The pulmonic valve was normal in structure. Pulmonic valve regurgitation is not visualized. No evidence of pulmonic stenosis. Aorta: Aortic dilatation noted. There is mild dilatation of the aortic root, measuring 43 mm. There is mild dilatation of the ascending aorta, measuring 44 mm. Venous: The inferior vena cava is dilated in size with less than 50% respiratory variability, suggesting right atrial  pressure of 15 mmHg.   LEFT VENTRICLE PLAX 2D LVIDd:         7.00 cm  Diastology LVIDs:         6.40 cm  LV e' medial:    3.30 cm/s LV PW:         0.90 cm  LV E/e' medial:  36.1 LV IVS:        0.80 cm  LV e' lateral:   4.30 cm/s LVOT diam:     2.30 cm  LV E/e' lateral: 27.7 LV SV:         32 LV SV Index:   15 LVOT Area:     4.15 cm                          3D Volume EF:                         3D EF:        12 %                         LV EDV:       323 ml                         LV ESV:       285 ml                         LV SV:        39 ml RIGHT VENTRICLE RV S prime:     13.80 cm/s TAPSE (M-mode): 2.2 cm LEFT ATRIUM              Index       RIGHT ATRIUM           Index LA diam:        4.20 cm  2.04 cm/m  RA Area:     19.00 cm LA Vol (A2C):   113.0 ml 55.01 ml/m RA Volume:   56.00 ml  27.26 ml/m LA Vol (A4C):   107.0 ml 52.09 ml/m LA Biplane Vol: 111.0 ml 54.04 ml/m  AORTIC VALVE LVOT Vmax:   57.00 cm/s LVOT Vmean:  37.500 cm/s LVOT VTI:    0.076 m AI PHT:      332 msec  AORTA Ao Root diam: 4.30 cm Ao Asc diam:  4.40 cm MITRAL VALVE                 TRICUSPID VALVE MV Area (PHT): 7.29 cm      TR Peak grad:   36.2 mmHg MV Peak grad:  7.2 mmHg      TR Vmax:        301.00 cm/s MV Mean grad:  4.0 mmHg MV Vmax:       1.34 m/s      SHUNTS MV Vmean:      91.1 cm/s     Systemic VTI:  0.08 m MV Decel Time: 104 msec      Systemic Diam: 2.30 cm MR Peak  grad:    85.0 mmHg MR Mean grad:    53.5 mmHg MR Vmax:         461.00 cm/s MR Vmean:        344.0 cm/s MR PISA:         4.02 cm MR  PISA Eff ROA: 15 mm MR PISA Radius:  0.80 cm MV E velocity: 119.00 cm/s MV A velocity: 91.20 cm/s MV E/A ratio:  1.30 Kirk Ruths MD Electronically signed by Kirk Ruths MD Signature Date/Time: 10/26/2020/11:01:51 AM    Final    US Abdomen Limited RUQ (LIVER/GB)  Result Date: 10/26/2020 CLINICAL DATA:  Abnormal LFTs EXAM: ULTRASOUND ABDOMEN LIMITED RIGHT UPPER QUADRANT COMPARISON:  None. FINDINGS: Gallbladder: Prior cholecystectomy Common bile duct: Diameter: Normal caliber, 5 mm Liver: No focal lesion identified. Within normal limits in parenchymal echogenicity. Portal vein is patent on color Doppler imaging with normal direction of blood flow towards the liver. Other: Small right pleural effusion. IMPRESSION: Prior cholecystectomy. No acute findings in the right upper quadrant. Small right pleural effusion. Electronically Signed   By: Rolm Baptise M.D.   On: 10/26/2020 12:26    Microbiology: Recent Results (from the past 240 hour(s))  Resp Panel by RT-PCR (Flu A&B, Covid) Nasopharyngeal Swab     Status: None   Collection Time: 10/25/20  2:20 PM   Specimen: Nasopharyngeal Swab; Nasopharyngeal(NP) swabs in vial transport medium  Result Value Ref Range Status   SARS Coronavirus 2 by RT PCR NEGATIVE NEGATIVE Final    Comment: (NOTE) SARS-CoV-2 target nucleic acids are NOT DETECTED.  The SARS-CoV-2 RNA is generally detectable in upper respiratory specimens during the acute phase of infection. The lowest concentration of SARS-CoV-2 viral copies this assay can detect is 138 copies/mL. A negative result does not preclude SARS-Cov-2 infection and should not be used as the sole basis for treatment or other patient management decisions. A negative result may occur with  improper specimen collection/handling, submission of specimen other than nasopharyngeal swab,  presence of viral mutation(s) within the areas targeted by this assay, and inadequate number of viral copies(<138 copies/mL). A negative result must be combined with clinical observations, patient history, and epidemiological information. The expected result is Negative.  Fact Sheet for Patients:  EntrepreneurPulse.com.au  Fact Sheet for Healthcare Providers:  IncredibleEmployment.be  This test is no t yet approved or cleared by the [place] FDA and  has been authorized for detection and/or diagnosis of SARS-CoV-2 by FDA under an Emergency Use Authorization (EUA). This EUA will remain  in effect (meaning this test can be used) for the duration of the COVID-19 declaration under Section 564(b)(1) of the Act, 21 U.S.C.section 360bbb-3(b)(1), unless the authorization is terminated  or revoked sooner.       Influenza A by PCR NEGATIVE NEGATIVE Final   Influenza B by PCR NEGATIVE NEGATIVE Final    Comment: (NOTE) The Xpert Xpress SARS-CoV-2/FLU/RSV plus assay is intended as an aid in the diagnosis of influenza from Nasopharyngeal swab specimens and should not be used as a sole basis for treatment. Nasal washings and aspirates are unacceptable for Xpert Xpress SARS-CoV-2/FLU/RSV testing.  Fact Sheet for Patients: EntrepreneurPulse.com.au  Fact Sheet for Healthcare Providers: IncredibleEmployment.be  This test is not yet approved or cleared by the [place] FDA and has been authorized for detection and/or diagnosis of SARS-CoV-2 by FDA under an Emergency Use Authorization (EUA). This EUA will remain in effect (meaning this test can be used) for the duration of the COVID-19 declaration under Section 564(b)(1) of the Act, 21 U.S.C. section 360bbb-3(b)(1), unless the authorization is terminated or revoked.  Performed at [facility], [place] [address] [place], [place] [address]       Labs: Basic Metabolic Panel: Recent Labs  Lab 10/25/20 1247 10/26/20 6468 10/27/20 0455  10/28/20 0409 10/29/20 0608  NA 133* 134* 140 140 135  K 4.3 3.6 2.9* 4.9 3.2*  CL 97* 97* 100 99 97*  CO2 19* 22 27 28 26   GLUCOSE 157* 80 90 119* 112*  BUN 46* 45* 43* 39* 34*  CREATININE 1.68* 1.27* 1.10 1.10 0.85  CALCIUM 8.7* 8.3* 8.3* 9.0 8.2*  MG 1.9  --  2.2  --   --   PHOS  --   --  3.1  --   --    Liver Function Tests: Recent Labs  Lab 10/26/20 0438 10/26/20 1006 10/27/20 0455 10/28/20 0409 10/29/20 0608  AST 2,419* 1,957* 921* 499* 255*  ALT 1,898* 1,828* 1,357* 1,120* 726*  ALKPHOS 154* 162* 164* 158* 122  BILITOT 3.6* 3.9* 2.9* 3.3* 3.2*  PROT 6.4* 6.9 6.5 7.5 6.6  ALBUMIN 3.3* 3.5 3.3* 3.5 3.2*   No results for input(s): LIPASE, AMYLASE in the last 168 hours. Recent Labs  Lab 10/26/20 1412 10/27/20 0455  AMMONIA 13 23   CBC: Recent Labs  Lab 10/25/20 1247 10/26/20 0438 10/27/20 0455  WBC 10.9* 9.8 10.1  NEUTROABS  --   --  7.7  HGB 11.4* 11.9* 13.1  HCT 36.1* 35.9* 41.6  MCV 90.3 86.9 88.1  PLT 278 265 297   Cardiac Enzymes: No results for input(s): CKTOTAL, CKMB, CKMBINDEX, TROPONINI in the last 168 hours. BNP: BNP (last 3 results) Recent Labs    01/23/20 0837 10/25/20 1335  BNP 1,845.6* 2,346.9*    ProBNP (last 3 results) Recent Labs    10/01/20 1441  PROBNP 563.0*    CBG: No results for input(s): GLUCAP in the last 168 hours.     Signed:  Florencia Reasons MD, PhD, FACP  [facility] 10/29/2020, 3:15 PM

## 2020-10-29 NOTE — Progress Notes (Signed)
SATURATION QUALIFICATIONS: (This note is used to comply with regulatory documentation for home oxygen)  Patient Saturations on Room Air at Rest = 80%  Patient Saturations on Room Air while Ambulating 78%  Patient Saturations on 2 Liters of oxygen while Ambulating 95%  Please briefly explain why patient needs home oxygen:  Increase Oxygen demand when active, ambulating to bed to chair to bathroom pt desats on room air.

## 2020-10-29 NOTE — Progress Notes (Signed)
Daily Progress Note   Patient Name: Philip Campbell       Date: 10/29/2020 DOB: [birth year]  Age: 70 y.o. MRN#: [id number] Attending Physician: Florencia Reasons, MD Primary Care Physician: Libby Maw, MD Admit Date: 10/25/2020  Reason for Consultation/Follow-up: Establishing goals of care  Subjective:  "I've been here for several days, I want to go HOME!. Nothing is getting better, they told me nothing will get better."  Patient seen and examined, discussed with him about his serious illness, see below    Length of Stay: 4  Current Medications: Scheduled Meds:  . enoxaparin (LOVENOX) injection  40 mg Subcutaneous Q24H  . furosemide  40 mg Oral Daily  . metoprolol succinate  25 mg Oral Daily  .  morphine injection  0.5 mg Intravenous Once  . pantoprazole  40 mg Oral Daily  . sodium chloride flush  3 mL Intravenous Q12H    Continuous Infusions: . sodium chloride      PRN Meds: sodium chloride, LORazepam, sodium chloride flush  Physical Exam         Resting in bed Lungs clear S1-S2 Has shortness of breath with prolonged conversations He does not have any peripheral edema Awake alert oriented.  Vital Signs: BP 101/79 (BP Location: Left Arm)   Pulse (!) 108   Temp 97.6 F (36.4 C) (Oral)   Resp 20   Ht 5\' 11"  (1.803 m)   Wt 71.1 kg Comment: rechecked standing on same scale as previous day  SpO2 97%   BMI 21.86 kg/m  SpO2: SpO2: 97 % O2 Device: O2 Device: Nasal Cannula O2 Flow Rate: O2 Flow Rate (L/min): 2 L/min  Intake/output summary:   Intake/Output Summary (Last 24 hours) at 10/29/2020 1202 Last data filed at 10/29/2020 0539 Gross per 24 hour  Intake 840 ml  Output 1470 ml  Net -630 ml   LBM: Last BM Date: 11/24/20 Baseline Weight: Weight: 85.3 kg Most  recent weight: Weight: 71.1 kg (rechecked standing on same scale as previous day)       Palliative Assessment/Data:    Flowsheet Rows   Flowsheet Row Most Recent Value  Intake Tab   Referral Department Hospitalist  Unit at Time of Referral Med/Surg Unit  Palliative Care Primary Diagnosis Cancer  Date Notified 10/26/20  Palliative Care Type Return patient  Palliative Care  Reason for referral Clarify Goals of Care  Date of Admission 10/25/20  Date first seen by Palliative Care 10/27/20  # of days Palliative referral response time 1 Day(s)  # of days IP prior to Palliative referral 1  Clinical Assessment   Palliative Performance Scale Score 50%  Psychosocial & Spiritual Assessment   Palliative Care Outcomes   Patient/Family meeting held? Yes  Who was at the meeting? patient      Patient Active Problem List   Diagnosis Date Noted  . Abnormal LFTs (liver function tests) 10/26/2020  . CHF exacerbation ([place]) 10/25/2020  . Goals of care, counseling/discussion 04/23/2020  . ASCVD (arteriosclerotic cardiovascular disease) 02/21/2020  . Pleural effusion 02/21/2020  . Elevated glucose 02/21/2020  . Acute on chronic systolic CHF (congestive heart failure) ([place]) 01/23/2020  . GERD (gastroesophageal reflux disease) 01/23/2020  . Chronic systolic CHF (congestive heart failure), NYHA class 4 ([place]) 01/23/2020  . Non-ischemic cardiomyopathy ([place]) 01/23/2020  . Acute respiratory failure with hypoxia ([place]) 01/23/2020  . Moderate to severe mitral regurgitation 01/23/2020  . Mass of upper lobe of right lung 01/23/2020    Palliative Care Assessment & Plan   Patient Profile:   Assessment: acute on chronic combined CHF, also with transaminitis deemed to be secondary to hepatic congestion.  Patient reportedly has history of underlying nonobstructive coronary artery disease also with previous history of non-small cell lung cancer status post radiation.  He has moderate to severe mitral  regurgitation.   Recommendations/Plan:  Goals of care discussions with the patient about the above-mentioned serious illnesses that he is facing.  Patient is awake alert oriented, he has awareness of the serious nature of his illness, more than anything he wishes to be home.  He is accepting of hospice support.  Goals of Care and Additional Recommendations:  Limitations on Scope of Treatment: Full Comfort Care  Code Status:    Code Status Orders  (From admission, onward)         Start     Ordered   10/28/20 1738  Do not attempt resuscitation (DNR)  Continuous       Question Answer Comment  In the event of cardiac or respiratory ARREST Do not call a "code blue"   In the event of cardiac or respiratory ARREST Do not perform Intubation, CPR, defibrillation or ACLS   In the event of cardiac or respiratory ARREST Use medication by any route, position, wound care, and other measures to relive pain and suffering. May use oxygen, suction and manual treatment of airway obstruction as needed for comfort.      10/28/20 1737        Code Status History    Date Active Date Inactive Code Status Order ID Comments User Context   10/25/2020 2013 10/28/2020 1737 Full Code [id number]  Oswald Hillock, MD Inpatient   01/23/2020 1609 01/26/2020 1540 Full Code [id number]  Annita Brod, MD Inpatient   Advance Care Planning Activity       Prognosis:   < 3 months  Discharge Planning:  Home with Hospice  Care plan was discussed with patient, [facility] MD  Thank you for allowing the Palliative Medicine Team to assist in the care of this patient.   Time In: 11.30 Time Out: 12.05 Total Time 35 Prolonged Time Billed  no       Greater than 50%  of this time was spent counseling and coordinating care related to the above assessment and plan.  Loistine Chance,  MD  Please contact Palliative Medicine Team phone at [phone] for questions and concerns.

## 2020-10-30 ENCOUNTER — Telehealth: Payer: Self-pay

## 2020-10-30 NOTE — Telephone Encounter (Signed)
Tammy calling w/ [facility], Hospice, to see if Dr. Ethelene Hal will still be attending Doctor for pt.  The family would like know in order to get a nurse out to see him for Hospice care. CB#[phone] F.Y.I Tammy is trying to get a nurse to see him today.  I informed Tammy that Dr. Ethelene Hal is out of office but I will send message to our Doctor of the day.  Please advise.

## 2020-10-30 NOTE — Telephone Encounter (Signed)
Yes ok to give verbal order from me for hospice nurse

## 2020-10-30 NOTE — Telephone Encounter (Signed)
I'm not sure but Philip Campbell said that she just need to know if Dr. Ethelene Hal is going to be the attending physician for pt.  They need a verbal order stating that pt can have a hospice nurse from here on out.  Number given in message below.

## 2020-10-30 NOTE — Telephone Encounter (Signed)
I called Tammy back from [facility] and informed her of Dr. Vivia Ewing message for the ok to proceed with hospice help for the patient.

## 2020-10-30 NOTE — Telephone Encounter (Signed)
Dr. Ethelene Hal is still pts PCP but I am happy to sign or give verbal orders since he is out of the office. What is specifically needed from me?

## 2020-10-31 ENCOUNTER — Telehealth: Payer: Self-pay

## 2020-10-31 NOTE — Telephone Encounter (Signed)
Brandon Melnick calling from [facility], hospice, to ask Dr. Loletha Grayer if she could send in some comfort meds for the patient.  Patient was discharged yesterday from [facility] that I would send the message back to provider.   Please advise.  CB# [phone]

## 2020-10-31 NOTE — Telephone Encounter (Signed)
Returned Maura's call to go over message below no answer LMTCB

## 2020-10-31 NOTE — Telephone Encounter (Signed)
I am fine to be PCP and after reviewing pts chart, I attest to terminal illness of lung cancer

## 2020-10-31 NOTE — Telephone Encounter (Signed)
Please advise message below  °

## 2020-10-31 NOTE — Telephone Encounter (Signed)
Thank you, spoke with Tammy who verbally understood message below PCP will be changed over to you.

## 2020-10-31 NOTE — Telephone Encounter (Signed)
Would you change patients PCP to Dr. Bryan Lemma from Dr. Ethelene Hal?

## 2020-10-31 NOTE — Telephone Encounter (Signed)
Ok thank you for the information. Fine for hospice RN to continue to give those meds

## 2020-10-31 NOTE — Telephone Encounter (Signed)
Is there a standard medication set for hospice pts that they have? In the past with other pts on home hospice, there has been an order set or standing med orders for meds like ativan, morphine, etc.

## 2020-10-31 NOTE — Telephone Encounter (Signed)
Spoke to [place] with Hospice.  She was able to get him some medications.  They gave him the following: MsContin 30 mg,  every 12 hours Liquid Morphine 20 mg/ml, 10 mg every 3 hours PRN for break through pain,  Temazepam 15 mg, 1 at bedtime for sleep.   He is on 4 litters O2.  Dm/cma

## 2020-10-31 NOTE — Telephone Encounter (Signed)
Unsure who message should be sent to due to Dr. Ethelene Hal being out and patient discharged already. Please advise if able.

## 2020-11-01 ENCOUNTER — Other Ambulatory Visit: Payer: Self-pay | Admitting: Family Medicine

## 2020-11-12 ENCOUNTER — Telehealth: Payer: Self-pay | Admitting: Family Medicine

## 2020-11-12 NOTE — Telephone Encounter (Signed)
I tried to call Philip Campbell back to let her know whats going on but had to leave a message

## 2020-11-12 NOTE — Telephone Encounter (Signed)
I have not yet received it. I'm not able to access [facility] yet so on Thursday last week, the plan was for the hard copy to be sent/brought here. Lea had looked into this for me and since I do not yet have online access, signing a hard copy was the plan

## 2020-11-12 NOTE — Telephone Encounter (Signed)
Cyndi from [employer] home in [place] calling to check and see if death certificate had been filled out. Call back [phone].

## 2020-11-13 NOTE — Telephone Encounter (Signed)
Death certificate filled out and placed in envelope to mail back to  [facility] home. Copy of certificate placed in scan box.  Dm/cma

## 2020-11-13 NOTE — Telephone Encounter (Signed)
I talked to [facility]  yesterday and she said their system was down but that she would send as soon as it was back up

## 2020-11-25 DEATH — deceased

## 2021-02-18 IMAGING — CR DG CHEST 1V PORT
1 series · 1 of 1 positions shown · non-contrast
Comparison: 01/23/2020

CLINICAL DATA: Status post lung biopsy

EXAM:
PORTABLE CHEST 1 VIEW

[w chest pa]
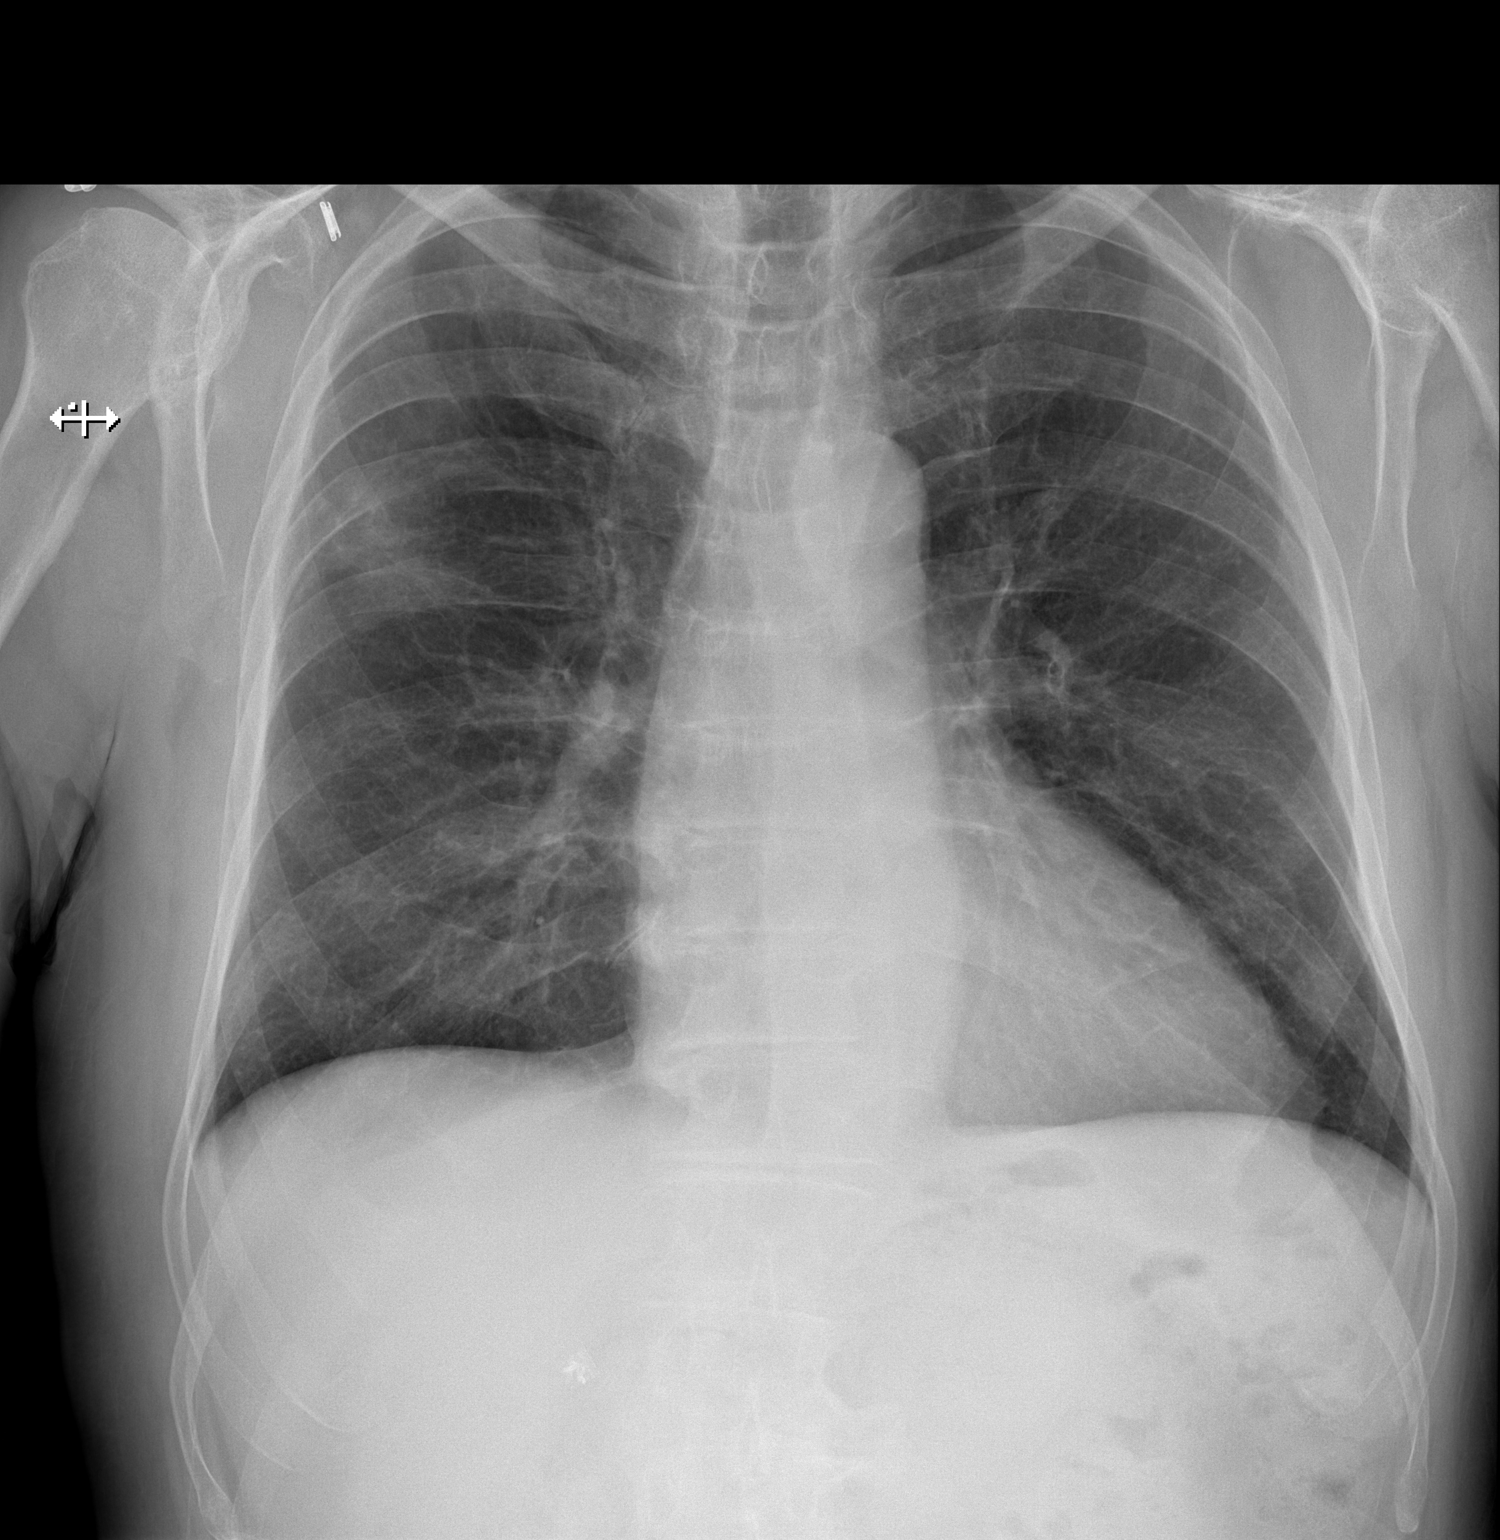

[1 of 1 positions shown; findings below may reference images not displayed]

FINDINGS: Cardiac shadow is within normal limits. Patchy density is noted in
the right upper lobe consistent with the patient's given clinical
history. No sizable pneumothorax is noted. No acute bony abnormality
is noted.
IMPRESSION: No evidence of pneumothorax following lung biopsy.

## 2021-06-17 NOTE — Telephone Encounter (Signed)
Chart review.

## 2021-07-04 ENCOUNTER — Telehealth: Payer: Self-pay

## 2021-07-04 NOTE — Telephone Encounter (Signed)
Unable to reach pt LVM for pt to call back to schedule AWV.
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
